# Patient Record
Sex: Male | Born: 1985 | Race: Black or African American | Hispanic: No | Marital: Single | Smoking: Never smoker
Health system: Southern US, Community
[De-identification: ages and names within clinical notes are randomized; demographics above are authoritative.]

## PROBLEM LIST (undated history)

## (undated) DIAGNOSIS — R011 Cardiac murmur, unspecified: Secondary | ICD-10-CM

## (undated) DIAGNOSIS — K5080 Crohn's disease of both small and large intestine without complications: Secondary | ICD-10-CM

## (undated) DIAGNOSIS — E559 Vitamin D deficiency, unspecified: Secondary | ICD-10-CM

## (undated) DIAGNOSIS — T7840XA Allergy, unspecified, initial encounter: Secondary | ICD-10-CM

## (undated) HISTORY — PX: OTHER SURGICAL HISTORY: SHX169

## (undated) HISTORY — DX: Cardiac murmur, unspecified: R01.1

## (undated) HISTORY — DX: Crohn's disease of both small and large intestine without complications: K50.80

## (undated) HISTORY — PX: COLONOSCOPY: SHX174

## (undated) HISTORY — DX: Allergy, unspecified, initial encounter: T78.40XA

## (undated) HISTORY — DX: Vitamin D deficiency, unspecified: E55.9

---

## 2015-05-04 ENCOUNTER — Encounter: Payer: Self-pay | Admitting: Gastroenterology

## 2015-05-18 ENCOUNTER — Encounter: Payer: Self-pay | Admitting: Nurse Practitioner

## 2015-05-18 ENCOUNTER — Ambulatory Visit (INDEPENDENT_AMBULATORY_CARE_PROVIDER_SITE_OTHER): Payer: 59 | Admitting: Nurse Practitioner

## 2015-05-18 VITALS — BP 124/71 | HR 65 | Temp 97.6°F | Ht 72.0 in | Wt 161.2 lb

## 2015-05-18 DIAGNOSIS — K50819 Crohn's disease of both small and large intestine with unspecified complications: Secondary | ICD-10-CM | POA: Insufficient documentation

## 2015-05-18 DIAGNOSIS — R634 Abnormal weight loss: Secondary | ICD-10-CM

## 2015-05-18 NOTE — Progress Notes (Signed)
Primary Care Physician:  [name] Primary Gastroenterologist:  Dr. Oneida Alar  Chief Complaint  Patient presents with  . Crohn's Disease    HPI:   30 year old male presents to establish care, referred by PCP. His previous GI has retired. He has a history of early-onset Crohn's disease. He was initially diagnosed at age 85 by colonoscopy at [facility] on 12/24/1999 with found single ulcer in the cecum, single ulcer in the descending colon, ulcers and hepatic flexure, ulcers in the ascending colon and distal small bowel involvement. He was on Pentasa for number of years with no GI complaints, no history of fissures or fistulas. Another colonoscopy completed 03/16/2009 with findings of quiescent Crohn's colitis, multiple random colon biopsies which showed no active or acute inflammation. Office note with previous GI for 09/03/2012 for 3-4 months of right-sided abdominal pain and loss of appetite, noted increased stress of parenthood. Colonoscopy recommended but does not appear to been completed. He was again seen 01/07/2014 for past due colonoscopy to monitor Crohn's and at that time had not been on Pentasa for over a month because "it no longer helped." At that time he was having generalized abdominal pain with no bleeding, stable weight. Procedural note dated 01/27/2014 showed scattered inflammation in the rectosigmoid colon; inflammation in the transverse colon; stricture, erosions  and ulcerations in the terminal ileum (which were biopsied.) Random colon biopsies taken as well, no polyps noted. Surgical pathology found terminal ileum biopsies with evidence of ulceration, random colon biopsies with mild to moderate amounts of chronic active inflammation. He was treated with prednisone at that time. Which resolved his symptoms. Last office note provided with previous provider dated 02/27/2014 for one month follow-up after Crohn's disease with prednisone treatment with noted no problems. At that time it  appears he started him on Remicade.  Today he states he was started on Remicaide and with his second dose in December 2015 or January 2016 he developed shortness of breath and was deemed to have an allergic reaction and Remicaide was stopped. He has not been on any medication for Crohn's in about 1 year. He has been doing pretty good lately. Still has decreased appetite, has lost 30 pounds in the past year. Wants to be 170 to 175, home weights around 150 to 155. Is having about 1-2 "normal" bowel movements a day, no hematochezia noted. Denies melena. No abdominal pain currently. Last episode of abdominal pain around Thanksgiving, but stools remained normal. Denies recent infections, fever, chills, N/V. No arthralgias currently, although he has had them in his past. Denies chest pain, dyspnea, dizziness, lightheadedness, syncope, near syncope. Denies any other upper or lower GI symptoms.  Past Medical History  Diagnosis Date  . Crohn's disease of both small and large intestine [facility])     Past Surgical History  Procedure Laterality Date  . None to date      As of 05/18/15    No current outpatient prescriptions on file.   No current facility-administered medications for this visit.    Allergies as of 05/18/2015  . (No Known Allergies)    Family History  Problem Relation Age of Onset  . Colon cancer Neg Hx   . Hypertension Mother   . Diabetes Mother   . Liver cancer Father     deceased age 60  . CVA Father     Social History   Social History  . Marital Status: Unknown    Spouse Name: N/A  . Number of  Children: N/A  . Years of Education: N/A   Occupational History  . Not on file.   Social History Main Topics  . Smoking status: Never Smoker   . Smokeless tobacco: Not on file  . Alcohol Use: No  . Drug Use: No  . Sexual Activity: Not on file   Other Topics Concern  . Not on file   Social History Narrative  . No narrative on file    Review of Systems: 10-point ROS  negative except as per HPI.    Physical Exam: BP 124/71 mmHg  Pulse 65  Temp(Src) 97.6 F (36.4 C) (Oral)  Ht 6' (1.829 m)  Wt 161 lb 3.2 oz (73.12 kg)  BMI 21.86 kg/m2 General:   Alert and oriented. Pleasant and cooperative. Well-nourished and well-developed.  Head:  Normocephalic and atraumatic. Eyes:  Without icterus, sclera clear and conjunctiva pink.  Ears:  Normal auditory acuity. Cardiovascular:  S1, S2 present without murmurs appreciated. Extremities without clubbing or edema. Respiratory:  Clear to auscultation bilaterally. No wheezes, rales, or rhonchi. No distress.  Gastrointestinal:  +BS, soft, non-tender and non-distended. No HSM noted. No guarding or rebound. No masses appreciated.  Rectal:  Deferred  Musculoskalatal:  Symmetrical without gross deformities. Joints non-tender to palpation including shoulders, knees, wrists, elbows, ankles. Skin:  Intact without significant lesions or rashes. Neurologic:  Alert and oriented x4;  grossly normal neurologically. Psych:  Alert and cooperative. Normal mood and affect. Heme/Lymph/Immune: No excessive bruising noted.    05/18/2015 3:22 PM

## 2015-05-18 NOTE — Assessment & Plan Note (Addendum)
30 year old male initially diagnosed with Crohn's disease at age 48 on colonoscopy and admission at [facility]. Disease was noted in terminal ileum and segmentally in the colon as noted in history of present illness. He did quite well for some time on Pentasa, however in 2015 it stopped working and he stopped taking it. Last colonoscopy completed 01/2014 which according to the procedural note found scattered inflammation in the rectosigmoid colon, inflammation the transverse colon, as well as stricture, erosions, and ulcerations in the terminal ileum. However, follow-up office visit stated Crohn's disease without complication. After his last colonoscopy he was started on Remicade. After second dose he developed significant allergic reaction including dyspnea and feeling of his throat closing up. His Remicade was stopped. Since then he has not been on any medication for his Crohn's disease because his GI provider retired.  Currently he is asymptomatic for the most part. Does have occasional cramping, last episode of abdominal pain around Thanksgiving of 2016. Has regular bowel movements, no diarrhea, no bleeding. While it is good that he is not symptomatic, his colonoscopies in the past have demonstrated significant disease. At this point it may be worthwhile to repeat a colonoscopy to correlate his asymptomatic state with physical findings of his colon and terminal ileum. He will likely need to be restarted on a medication. Mesalamine failed in the past, allergic reaction to Remicade. He may benefit from starting Humira after appropriate workup including blood tests, quantiferon, and hepatitis B screening. He states his only TB screening was a subdermal placed PPD.  I will discuss the case further with Dr. Oneida Alar for definitive plan moving forward and notify the patient.  We'll order CT of the abdomen and pelvis with contrast to check for any signs of stricture or obstruction. We'll update labs  including CBC, CMP, TSH. If the CT is okay and shows no obstruction we'll proceed with EGD and colonoscopy for Weight loss and Crohn surveillance before his next follow-up in 4-6 weeks.

## 2015-05-18 NOTE — Patient Instructions (Addendum)
1. Return for follow-up in 4-6 weeks. 2. We will notify you of any necessary blood work that needs to be completed.

## 2015-05-20 ENCOUNTER — Telehealth: Payer: Self-pay | Admitting: Nurse Practitioner

## 2015-05-20 NOTE — Addendum Note (Signed)
Addended by: Gordy Levan, ERIC A on: 05/20/2015 03:55 PM   Modules accepted: Orders

## 2015-05-20 NOTE — Telephone Encounter (Signed)
Called pt and was unable to leave a message no voicemail set up

## 2015-05-20 NOTE — Telephone Encounter (Signed)
Faxed labs to [facility] for CT has been sent to clinical review. Awaiting to see if any other clinical information needs to faxed to them.   Case # is [id number]

## 2015-05-20 NOTE — Progress Notes (Signed)
cc'ed to pcp °

## 2015-05-20 NOTE — Telephone Encounter (Signed)
Please notify the patient that I've put in routine labs for him to have drawn. I also put in for a CT scan to evaluate for the narrowing they saw on his last colonoscopy. If his CT comes back ok, we will want him to have an EGD and TCS with [facility] with 25 mg pre-procedure Phenergan before his next follow-up.

## 2015-05-21 NOTE — Telephone Encounter (Signed)
Noted  

## 2015-05-21 NOTE — Telephone Encounter (Signed)
Routing to [employer]

## 2015-05-21 NOTE — Telephone Encounter (Signed)
Spoke with pt and he is aware of ct and labs.   CT is still pending.  I will call pt once ct is set and that way he can make only 1 trip to [place] from [place]

## 2015-05-25 NOTE — Telephone Encounter (Signed)
Pt is aware of CT appt and will be getting labs done. Pt request email with information to be sent to him.   Sent email

## 2015-05-25 NOTE — Telephone Encounter (Signed)
CT approved [id number]

## 2015-05-28 ENCOUNTER — Ambulatory Visit (HOSPITAL_COMMUNITY): Payer: 59

## 2015-06-03 ENCOUNTER — Ambulatory Visit (HOSPITAL_COMMUNITY)
Admission: RE | Admit: 2015-06-03 | Discharge: 2015-06-03 | Disposition: A | Discharge status: Home / Self Care | Payer: 59 | Source: Ambulatory Visit | Attending: Nurse Practitioner | Admitting: Nurse Practitioner

## 2015-06-03 DIAGNOSIS — K50819 Crohn's disease of both small and large intestine with unspecified complications: Secondary | ICD-10-CM | POA: Diagnosis not present

## 2015-06-03 DIAGNOSIS — R634 Abnormal weight loss: Secondary | ICD-10-CM | POA: Insufficient documentation

## 2015-06-03 LAB — COMPREHENSIVE METABOLIC PANEL
ALBUMIN: 3.6 g/dL (ref 3.6–5.1)
ALT: 10 U/L (ref 9–46)
AST: 13 U/L (ref 10–40)
Alkaline Phosphatase: 76 U/L (ref 40–115)
BUN: 6 mg/dL — ABNORMAL LOW (ref 7–25)
CALCIUM: 9.4 mg/dL (ref 8.6–10.3)
CHLORIDE: 101 mmol/L (ref 98–110)
CO2: 28 mmol/L (ref 20–31)
Creat: 1.09 mg/dL (ref 0.60–1.35)
GLUCOSE: 74 mg/dL (ref 65–99)
Potassium: 4.1 mmol/L (ref 3.5–5.3)
Sodium: 137 mmol/L (ref 135–146)
Total Bilirubin: 0.5 mg/dL (ref 0.2–1.2)
Total Protein: 6.4 g/dL (ref 6.1–8.1)

## 2015-06-03 LAB — CBC WITH DIFFERENTIAL/PLATELET
BASOS PCT: 1 % (ref 0–1)
Basophils Absolute: 0 10*3/uL (ref 0.0–0.1)
EOS PCT: 4 % (ref 0–5)
Eosinophils Absolute: 0.1 10*3/uL (ref 0.0–0.7)
HEMATOCRIT: 38.8 % — AB (ref 39.0–52.0)
HEMOGLOBIN: 12.9 g/dL — AB (ref 13.0–17.0)
LYMPHS PCT: 18 % (ref 12–46)
Lymphs Abs: 0.6 10*3/uL — ABNORMAL LOW (ref 0.7–4.0)
MCH: 28.7 pg (ref 26.0–34.0)
MCHC: 33.2 g/dL (ref 30.0–36.0)
MCV: 86.4 fL (ref 78.0–100.0)
MONO ABS: 0.3 10*3/uL (ref 0.1–1.0)
MONOS PCT: 9 % (ref 3–12)
MPV: 8.7 fL (ref 8.6–12.4)
NEUTROS ABS: 2.4 10*3/uL (ref 1.7–7.7)
Neutrophils Relative %: 68 % (ref 43–77)
Platelets: 304 10*3/uL (ref 150–400)
RBC: 4.49 MIL/uL (ref 4.22–5.81)
RDW: 14.2 % (ref 11.5–15.5)
WBC: 3.6 10*3/uL — AB (ref 4.0–10.5)

## 2015-06-03 LAB — TSH: TSH: 1.47 mIU/L (ref 0.40–4.50)

## 2015-06-03 MED ORDER — IOHEXOL 300 MG/ML  SOLN
100.0000 mL | Freq: Once | INTRAMUSCULAR | Status: AC | PRN
Start: 2015-06-03 — End: 2015-06-03
  Administered 2015-06-03: 100 mL via INTRAVENOUS

## 2015-06-18 ENCOUNTER — Encounter (INDEPENDENT_AMBULATORY_CARE_PROVIDER_SITE_OTHER): Payer: Self-pay

## 2015-06-18 ENCOUNTER — Encounter: Payer: Self-pay | Admitting: Gastroenterology

## 2015-06-18 ENCOUNTER — Other Ambulatory Visit: Payer: Self-pay

## 2015-06-18 ENCOUNTER — Ambulatory Visit (INDEPENDENT_AMBULATORY_CARE_PROVIDER_SITE_OTHER): Payer: 59 | Admitting: Gastroenterology

## 2015-06-18 ENCOUNTER — Other Ambulatory Visit: Payer: Self-pay | Admitting: Gastroenterology

## 2015-06-18 VITALS — BP 134/82 | HR 79 | Temp 97.9°F | Ht 73.0 in | Wt 160.2 lb

## 2015-06-18 DIAGNOSIS — K50819 Crohn's disease of both small and large intestine with unspecified complications: Secondary | ICD-10-CM | POA: Diagnosis not present

## 2015-06-18 DIAGNOSIS — K5289 Other specified noninfective gastroenteritis and colitis: Secondary | ICD-10-CM

## 2015-06-18 DIAGNOSIS — K50019 Crohn's disease of small intestine with unspecified complications: Secondary | ICD-10-CM

## 2015-06-18 MED ORDER — NA SULFATE-K SULFATE-MG SULF 17.5-3.13-1.6 GM/177ML PO SOLN: 1.0000 | ORAL | Status: DC

## 2015-06-18 NOTE — Progress Notes (Signed)
ON RECALL  °

## 2015-06-18 NOTE — Progress Notes (Signed)
   Subjective:    Patient ID: Gerald Valentine, male    DOB: 1986/03/29, 30 y.o.   MRN: [id number]  Gerald Valentine  HPI Symptoms: abdominal pain. No diarrhea. MAY HAVE VOMITING: < 1-2X/MO. RARE BLOOD IN STOOL-1X Q3 MOS. IN SCHOOL ONLINE NOW AND IN BUSINESS. WEIGHT: LOST WEIGHT OVER LAST YEAR 30-40 LBS AND NOW STABLE AT 160 LBS. INITIALLY ON STEROIDS BUT LAST TIME  STOPPED PENTASA YEARS AGO: DIDN'T WORK. HAD REACTION REMICADE-2ND INFUSION. NO TB EXPOSURE-NEG PPD DEC 2015. NO EXPOSURE TO HEP B.  PT DENIES FEVER, CHILLS, nausea, melena, diarrhea, CHEST PAIN, SHORTNESS OF BREATH, CHANGE IN BOWEL IN HABITS, constipation,  problems swallowing, problems with sedation, OR heartburn or indigestion. NO SORES IN MOUTH, EYE PAIN, RASH ON LEGS, JOINT PAIN, OR BACK PAIN.  Past Medical History  Diagnosis Date  . Crohn's disease of both small and large intestine [facility])    Past Surgical History  Procedure Laterality Date  . None to date      As of 05/18/15   No Known Allergies  No current outpatient prescriptions on file.   Family History  Problem Relation Age of Onset  . Colon cancer Neg Hx   . Hypertension Mother   . Diabetes Mother   . Liver cancer Father     deceased age 64  . CVA Father    Social History   Social History  . Marital Status: Single    Spouse Name: N/A  . Number of Children: N/A  . Years of Education: N/A   Social History Main Topics  . Smoking status: Never Smoker   . Smokeless tobacco: None  . Alcohol Use: No  . Drug Use: No  . Sexual Activity: Not Asked   Other Topics Concern  . None   Social History Narrative   Review of Systems  PER HPI OTHERWISE ALL SYSTEMS ARE NEGATIVE. NO SWELLING IN KNEES IN PAST 2 YEARS.    Objective:   Physical Exam  Constitutional: He is oriented to person, place, and time. He appears well-developed and well-nourished. No distress.  HENT:  Head: Normocephalic and atraumatic.  Mouth/Throat: Oropharynx is clear and moist. No  oropharyngeal exudate.  Eyes: Pupils are equal, round, and reactive to light. No scleral icterus.  Neck: Normal range of motion. Neck supple.  Cardiovascular: Normal rate, regular rhythm and normal heart sounds.   Pulmonary/Chest: Effort normal and breath sounds normal. No respiratory distress.  Abdominal: Soft. Bowel sounds are normal. He exhibits no distension. There is no tenderness.  Musculoskeletal: He exhibits no edema.  Lymphadenopathy:    He has no cervical adenopathy.  Neurological: He is alert and oriented to person, place, and time.  Psychiatric: He has a normal mood and affect.  Vitals reviewed.     Assessment & Plan:

## 2015-06-18 NOTE — Patient Instructions (Signed)
Complete lab work.  COLONOSCOPY WITHIN THE NEXT MONTH.  WE WILL SUBMIT PAPERWORK TO START HUMIRA.  PLEASE CALL WITH QUESTIONS OR CONCERNS.  FOLLOW UP IN 3 MOS.

## 2015-06-18 NOTE — Assessment & Plan Note (Addendum)
DISEASE NOT CONTROLLED. CT SCAN JAN 2017-THICKENED ILEUM ON CT.  Complete lab work-TB assay/HEP B sAg. COLONOSCOPY WITHIN THE NEXT MONTH WITH ULTRASLIM SCOPE/SUPREP. CLEAR LIQUIDS AFTER 9 AM. PHENERGAN 12.5 MG IV IN PREOP. DISCUSSED PROCEDURE, BENEFITS, & RISKS: < 1% chance of medication reaction, bleeding, perforation, or rupture of spleen/liver. WE WILL SUBMIT PAPERWORK TO START HUMIRA. PLEASE CALL WITH QUESTIONS OR CONCERNS. FOLLOW UP IN 3 MOS.   GREATER THAN 50% WAS SPENT IN COUNSELING & COORDINATION OF CARE WITH THE PATIENT: DISCUSSED DIFFERENTIAL DIAGNOSIS, PROCEDURE, BENEFITS, RISKS, AND MANAGEMENT OF CROHN'S DISEASE. TOTAL ENCOUNTER TIME: 25 MINS.

## 2015-06-18 NOTE — Progress Notes (Signed)
CC'D TO PCP °

## 2015-06-18 NOTE — Progress Notes (Signed)
REVIEWED-NO ADDITIONAL RECOMMENDATIONS. 

## 2015-06-19 LAB — HEPATITIS B SURFACE ANTIGEN: HEP B S AG: NEGATIVE

## 2015-06-20 LAB — QUANTIFERON TB GOLD ASSAY (BLOOD)
Interferon Gamma Release Assay: NEGATIVE
Mitogen-Nil: 5.19 IU/mL
QUANTIFERON NIL VALUE: 0.03 [IU]/mL
Quantiferon Tb Ag Minus Nil Value: <0 IU/mL

## 2015-06-22 ENCOUNTER — Telehealth: Payer: Self-pay

## 2015-06-22 NOTE — Telephone Encounter (Signed)
TCS approval PA# [id number]

## 2015-06-30 ENCOUNTER — Telehealth: Payer: Self-pay | Admitting: Gastroenterology

## 2015-06-30 NOTE — Telephone Encounter (Signed)
PLEASE CALL PT. HIS TB AND HEP B TEST IS NEGATIVE.

## 2015-07-01 NOTE — Telephone Encounter (Signed)
LMOm to call.

## 2015-07-01 NOTE — Telephone Encounter (Signed)
Pt called and is aware.

## 2015-07-10 ENCOUNTER — Telehealth: Payer: Self-pay | Admitting: Nurse Practitioner

## 2015-07-10 ENCOUNTER — Encounter (HOSPITAL_COMMUNITY)
Admission: RE | Disposition: A | Discharge status: Home / Self Care | Payer: Self-pay | Source: Ambulatory Visit | Attending: Gastroenterology

## 2015-07-10 ENCOUNTER — Encounter (HOSPITAL_COMMUNITY): Payer: Self-pay | Admitting: *Deleted

## 2015-07-10 ENCOUNTER — Ambulatory Visit (HOSPITAL_COMMUNITY)
Admission: RE | Admit: 2015-07-10 | Discharge: 2015-07-10 | Disposition: A | Discharge status: Home / Self Care | Payer: 59 | Source: Ambulatory Visit | Attending: Gastroenterology | Admitting: Gastroenterology

## 2015-07-10 DIAGNOSIS — Z808 Family history of malignant neoplasm of other organs or systems: Secondary | ICD-10-CM | POA: Diagnosis not present

## 2015-07-10 DIAGNOSIS — R109 Unspecified abdominal pain: Secondary | ICD-10-CM | POA: Insufficient documentation

## 2015-07-10 DIAGNOSIS — R933 Abnormal findings on diagnostic imaging of other parts of digestive tract: Secondary | ICD-10-CM

## 2015-07-10 DIAGNOSIS — K5669 Other intestinal obstruction: Secondary | ICD-10-CM | POA: Insufficient documentation

## 2015-07-10 DIAGNOSIS — K5080 Crohn's disease of both small and large intestine without complications: Secondary | ICD-10-CM | POA: Diagnosis not present

## 2015-07-10 DIAGNOSIS — K5000 Crohn's disease of small intestine without complications: Secondary | ICD-10-CM | POA: Insufficient documentation

## 2015-07-10 DIAGNOSIS — K50019 Crohn's disease of small intestine with unspecified complications: Secondary | ICD-10-CM

## 2015-07-10 DIAGNOSIS — K5289 Other specified noninfective gastroenteritis and colitis: Secondary | ICD-10-CM

## 2015-07-10 DIAGNOSIS — K529 Noninfective gastroenteritis and colitis, unspecified: Secondary | ICD-10-CM | POA: Insufficient documentation

## 2015-07-10 HISTORY — PX: COLONOSCOPY: SHX5424

## 2015-07-10 SURGERY — COLONOSCOPY
Anesthesia: Moderate Sedation

## 2015-07-10 MED ORDER — MIDAZOLAM HCL 5 MG/5ML IJ SOLN
INTRAMUSCULAR | Status: AC
  Filled 2015-07-10: qty 10

## 2015-07-10 MED ORDER — PROMETHAZINE HCL 25 MG/ML IJ SOLN
12.5000 mg | Freq: Once | INTRAMUSCULAR | Status: AC
  Administered 2015-07-10: 12.5 mg via INTRAVENOUS

## 2015-07-10 MED ORDER — MIDAZOLAM HCL 5 MG/5ML IJ SOLN
INTRAMUSCULAR | Status: DC | PRN
  Administered 2015-07-10 (×2): 1 mg via INTRAVENOUS
  Administered 2015-07-10: 2 mg via INTRAVENOUS
  Administered 2015-07-10: 1 mg via INTRAVENOUS

## 2015-07-10 MED ORDER — STERILE WATER FOR IRRIGATION IR SOLN
Status: DC | PRN
  Administered 2015-07-10: 11:00:00

## 2015-07-10 MED ORDER — PREDNISONE 10 MG PO TABS: ORAL_TABLET | ORAL | Status: DC

## 2015-07-10 MED ORDER — PROMETHAZINE HCL 25 MG/ML IJ SOLN
INTRAMUSCULAR | Status: AC
  Filled 2015-07-10: qty 1

## 2015-07-10 MED ORDER — MEPERIDINE HCL 100 MG/ML IJ SOLN
INTRAMUSCULAR | Status: DC | PRN
  Administered 2015-07-10 (×3): 25 mg

## 2015-07-10 MED ORDER — SODIUM CHLORIDE 0.9% FLUSH
INTRAVENOUS | Status: AC
  Filled 2015-07-10: qty 10

## 2015-07-10 MED ORDER — SODIUM CHLORIDE 0.9 % IV SOLN
INTRAVENOUS | Status: DC
  Administered 2015-07-10: 1000 mL via INTRAVENOUS

## 2015-07-10 MED ORDER — MEPERIDINE HCL 100 MG/ML IJ SOLN
INTRAMUSCULAR | Status: AC
  Filled 2015-07-10: qty 2

## 2015-07-10 NOTE — Interval H&P Note (Signed)
History and Physical Interval Note:  07/10/2015 10:37 AM  Gerald Valentine  has presented today for surgery, with the diagnosis of crohn's ileitis/colitis  The various methods of treatment have been discussed with the patient and family. After consideration of risks, benefits and other options for treatment, the patient has consented to  Procedure(s) with comments: COLONOSCOPY (N/A) - 1030 as a surgical intervention .  The patient's history has been reviewed, patient examined, no change in status, stable for surgery.  I have reviewed the patient's chart and labs.  Questions were answered to the patient's satisfaction.     [employer]

## 2015-07-10 NOTE — H&P (View-Only) (Signed)
   Subjective:    Patient ID: Gerald Valentine, male    DOB: [birth year], 30 y.o.   MRN: [id number]  Gerald Valentine  HPI Symptoms: abdominal pain. No diarrhea. MAY HAVE VOMITING: < 1-2X/MO. RARE BLOOD IN STOOL-1X Q3 MOS. IN SCHOOL ONLINE NOW AND IN BUSINESS. WEIGHT: LOST WEIGHT OVER LAST YEAR 30-40 LBS AND NOW STABLE AT 160 LBS. INITIALLY ON STEROIDS BUT LAST TIME  STOPPED PENTASA YEARS AGO: DIDN'T WORK. HAD REACTION REMICADE-2ND INFUSION. NO TB EXPOSURE-NEG PPD DEC 2015. NO EXPOSURE TO HEP B.  PT DENIES FEVER, CHILLS, nausea, melena, diarrhea, CHEST PAIN, SHORTNESS OF BREATH, CHANGE IN BOWEL IN HABITS, constipation,  problems swallowing, problems with sedation, OR heartburn or indigestion. NO SORES IN MOUTH, EYE PAIN, RASH ON LEGS, JOINT PAIN, OR BACK PAIN.  Past Medical History  Diagnosis Date  . Crohn's disease of both small and large intestine [facility])    Past Surgical History  Procedure Laterality Date  . None to date      As of 05/18/15   No Known Allergies  No current outpatient prescriptions on file.   Family History  Problem Relation Age of Onset  . Colon cancer Neg Hx   . Hypertension Mother   . Diabetes Mother   . Liver cancer Father     deceased age 83  . CVA Father    Social History   Social History  . Marital Status: Single    Spouse Name: N/A  . Number of Children: N/A  . Years of Education: N/A   Social History Main Topics  . Smoking status: Never Smoker   . Smokeless tobacco: None  . Alcohol Use: No  . Drug Use: No  . Sexual Activity: Not Asked   Other Topics Concern  . None   Social History Narrative   Review of Systems  PER HPI OTHERWISE ALL SYSTEMS ARE NEGATIVE. NO SWELLING IN KNEES IN PAST 2 YEARS.    Objective:   Physical Exam  Constitutional: He is oriented to person, place, and time. He appears well-developed and well-nourished. No distress.  HENT:  Head: Normocephalic and atraumatic.  Mouth/Throat: Oropharynx is clear and moist. No  oropharyngeal exudate.  Eyes: Pupils are equal, round, and reactive to light. No scleral icterus.  Neck: Normal range of motion. Neck supple.  Cardiovascular: Normal rate, regular rhythm and normal heart sounds.   Pulmonary/Chest: Effort normal and breath sounds normal. No respiratory distress.  Abdominal: Soft. Bowel sounds are normal. He exhibits no distension. There is no tenderness.  Musculoskeletal: He exhibits no edema.  Lymphadenopathy:    He has no cervical adenopathy.  Neurological: He is alert and oriented to person, place, and time.  Psychiatric: He has a normal mood and affect.  Vitals reviewed.     Assessment & Plan:

## 2015-07-10 NOTE — Telephone Encounter (Signed)
Per endo please schedule May follow-up/PP appointment for this patient.

## 2015-07-10 NOTE — Discharge Instructions (Signed)
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   FOLLOW A LOW RESIDUE DIET.  START PREDNISONE AND TAPER OVER 1 MONTH.  AVOID TOBACCO PRODUCTS.  DO NOT USE ASPIRIN, BC/GOODY POWDERS,  OR IBUPROFEN, MOTRIN, ALEVE, OR NAPROXEN.   USE TYLENOL  AS NEEDED FOR ABDOMINAL PAIN.   ADD A PROBIOTIC DAILY. USE PROBIOTICS FOR AT LEAST 6 MOS.  FOLLOW UP IN MAY 2017.  Colonoscopy Care After Read the instructions outlined below and refer to this sheet in the next week. These discharge instructions provide you with general information on caring for yourself after you leave the hospital. While your treatment has been planned according to the most current medical practices available, unavoidable complications occasionally occur. If you have any problems or questions after discharge, call DR. Daily Crate, [phone].  ACTIVITY  You may resume your regular activity, but move at a slower pace for the next 24 hours.   Take frequent rest periods for the next 24 hours.   Walking will help get rid of the air and reduce the bloated feeling in your belly (abdomen).   No driving for 24 hours (because of the medicine (anesthesia) used during the test).   You may shower.   Do not sign any important legal documents or operate any machinery for 24 hours (because of the anesthesia used during the test).    NUTRITION  Drink plenty of fluids.   You may resume your normal diet as instructed by your doctor.   Begin with a light meal and progress to your normal diet. Heavy or fried foods are harder to digest and may make you feel sick to your stomach (nauseated).   Avoid alcoholic beverages for 24 hours or as instructed.    MEDICATIONS  You may resume your normal medications.   WHAT YOU CAN EXPECT TODAY  Some feelings of bloating in the abdomen.   Passage of more gas than usual.   Spotting of blood in your stool or on the toilet paper  .  IF  YOU HAD POLYPS REMOVED DURING THE COLONOSCOPY:  No aspirin products for 7 days or as instructed.   Eat a soft diet IF YOU HAVE NAUSEA, BLOATING, ABDOMINAL PAIN, OR VOMITING.    FINDING OUT THE RESULTS OF YOUR TEST Not all test results are available during your visit. DR. Oneida Alar WILL CALL YOU WITHIN 14 DAYS OF YOUR PROCEDUE WITH YOUR RESULTS. Do not assume everything is normal if you have not heard from DR. Ia Leeb, CALL HER OFFICE AT [phone].  SEEK IMMEDIATE MEDICAL ATTENTION AND CALL THE OFFICE: [phone] IF:  You have more than a spotting of blood in your stool.   Your belly is swollen (abdominal distention).   You are nauseated or vomiting.   You have a temperature over 101F.   You have abdominal pain or discomfort that is severe or gets worse throughout the day.   Low Fiber and Residue Restricted Diet A low fiber diet restricts foods that contain carbohydrates that are not digested in the small intestine. A diet containing about 10 g of fiber is considered low fiber. The diet needs to be individualized to suit patient tolerances and preferences and to avoid unnecessary restrictions. Generally, the foods emphasized in a low fiber diet have no skins or seeds. They may have been processed to remove bran, germ, or husks. Cooking may not necessarily eliminate the fiber. Cooking may, in fact, enable a greater quantity of fiber to be  consumed in a lesser volume. Legumes and nuts are also restricted. The term low residue has also been used to describe low fiber diets, although the two are not the same. Residue refers to any substance that adds to bowel (colonic) contents, such as sloughed cells and intestinal bacteria, in addition to fiber. Residue-containing foods, prunes and prune juice, milk, and connective tissue from meats may also need to be eliminated. It is important to eliminate these foods during sudden (acute) attacks of inflammatory bowel disease, when there is a partial  obstruction due to another reason, or when minimal fecal output is desired. When these problems are gone, a more normal diet may be used.  PURPOSE  Reduce stool weight and volume.   CHOOSING FOODS Check labels, especially on foods from the starch list. Often, dietary fiber content is listed with the Nutrition Facts panel.  Breads and Starches  Allowed: White, [place], and pita breads, plain rolls, buns, or sweet rolls, doughnuts, waffles, pancakes, bagels. Plain muffins, sweet breads, biscuits, matzoth. Flour. Soda, saltine, or graham crackers. Pretzels, rusks, melba toast, zwieback. Cooked cereals: cornmeal, farina, cream cereals. Dry cereals: refined corn, wheat, rice, and oat cereals (check label). Potatoes prepared any way without skins, refined macaroni, spaghetti, noodles, refined rice.   Avoid: Bread, rolls, or crackers made with whole-wheat, multigrains, rye, bran seeds, nuts, or coconut. Corn tortillas, table-shells. Corn chips, tortilla chips. Cereals containing whole-grains, multigrains, bran, coconut, nuts, or raisins. Cooked or dry oatmeal. Coarse wheat cereals, granola. Cereals advertised as "high fiber." Potato skins. Whole-grain pasta, wild or brown rice. Popcorn.  Vegetables  Allowed:  Strained tomato and vegetable juices. Fresh: tender lettuce, cucumber, cabbage, spinach, bean sprouts. Cooked, canned: asparagus, bean sprouts, cut green or wax beans, cauliflower, pumpkin, beets, mushrooms, olives, spinach, yellow squash, tomato, tomato sauce (no seeds), zucchini (peeled), turnips. Canned sweet potatoes. Small amounts of celery, onion, radish, and green pepper may be used. Keep servings limited to  cup.   Avoid: Fresh, cooked, or canned: artichokes, baked beans, beet greens, broccoli, Brussels sprouts, French-style green beans, corn, kale, legumes, peas, sweet potatoes. Cooked: green or red cabbage, spinach. Avoid large servings of any vegetables.  Fruit  Allowed:  All fruit  juices except prune juice. Cooked or canned: apricots applesauce, cantaloupe, cherries, grapefruit, grapes, kiwi, mandarin oranges, peaches, pears, fruit cocktail, pineapple, plums, watermelon. Fresh: banana, grapes, cantaloupe, avocado, cherries, pineapple, grapefruit, kiwi, nectarines, peaches, oranges, blueberries, plums. Keep servings limited to  cup or 1 piece.   Avoid: Fresh: apple with or without skin, apricots, mango, pears, raspberries, strawberries. Prune juice, stewed or dried prunes. Dried fruits, raisins, dates. Avoid large servings of all fresh fruits.  Meat and Meat Substitutes  Allowed:  Ground or well-cooked tender beef, ham, veal, lamb, pork, or poultry. Eggs, plain cheese. Fish, oysters, shrimp, lobster, other seafood. Liver, organ meats.   Avoid: Tough, fibrous meats with gristle. Peanut butter, smooth or chunky. Cheese with seeds, nuts, or other foods not allowed. Nuts, seeds, legumes, dried peas, beans, lentils.  Milk  Allowed:  All milk products except those not allowed. Milk and milk product consumption should be minimal when low residue is desired.   Avoid: Yogurt that contains nuts or seeds.  Soups and Combination Foods  Allowed:  Bouillon, broth, or cream soups made from allowed foods. Any strained soup. Casseroles or mixed dishes made with allowed foods.   Avoid: Soups made from vegetables that are not allowed or that contain other foods not allowed.  Desserts and Sweets  Allowed:  Plain cakes and cookies, pie made with allowed fruit, pudding, custard, cream pie. Gelatin, fruit, ice, sherbet, frozen ice pops. Ice cream, ice milk without nuts. Plain hard candy, honey, jelly, molasses, syrup, sugar, chocolate syrup, gumdrops, marshmallows.   Avoid: Desserts, cookies, or candies that contain nuts, peanut butter, or dried fruits. Jams, preserves with seeds, marmalade.  Fats and Oils  Allowed:  Margarine, butter, cream, mayonnaise, salad oils, plain salad dressings  made from allowed foods. Plain gravy, crisp bacon without rind.   Avoid: Seeds, nuts, olives. Avocados.  Beverages  Allowed:  All, except those listed to avoid.   Avoid: Fruit juices with high pulp, prune juice.  Condiments  Allowed:  Ketchup, mustard, horseradish, vinegar, cream sauce, cheese sauce, cocoa powder. Spices in moderation: allspice, basil, bay leaves, celery powder or leaves, cinnamon, cumin powder, curry powder, ginger, mace, marjoram, onion or garlic powder, oregano, paprika, parsley flakes, ground pepper, rosemary, sage, savory, tarragon, thyme, turmeric.   Avoid: Coconut, pickles.  SAMPLE MEAL PLAN The following menu is provided as a sample. Your daily menu plans will vary. Be sure to include a minimum of the following each day in order to provide essential nutrients for the adult:  Starch/Bread/Cereal Group, 6 servings.   Fruit/Vegetable Group, 5 servings.   Meat/Meat Substitute Group, 2 servings.   Milk/Milk Substitute Group, 2 servings.  A serving is equal to  cup for fruits, vegetables, and cooked cereals or 1 piece for foods such as a piece of bread, 1 orange, or 1 apple. For dry cereals and crackers, use serving sizes listed on the label. Combination foods may count as full or partial servings from various food groups. Fats, desserts, and sweets may be added to the meal plan after the requirements for essential nutrients are met. SAMPLE MENU Breakfast   cup orange juice.   1 boiled egg.   1 slice white toast.   Margarine.    cup cornflakes.   1 cup milk.   Beverage.  Lunch   cup chicken noodle soup.   2 to 3 oz sliced roast beef.   2 slices seedless rye bread.   Mayonnaise.    cup tomato juice.   1 small banana.   Beverage.  Dinner  3 oz baked chicken.    cup scalloped potatoes.    cup cooked beets.   White dinner roll.   Margarine.    cup canned peaches.   Beverage.

## 2015-07-10 NOTE — Op Note (Signed)
[facility] Patient Name: Gerald Valentine Procedure Date: 07/10/2015 10:26 AM MRN: [id number] Date of Birth: 1986-03-13 Attending MD: Barney Drain , MD CSN: [id number] Age: 30 Admit Type: Outpatient Procedure:                Colonoscopy Indications:              Crohn's disease of the small bowel and                            colon-DIAGNOSED AT AGE 41. THICKENED ILEUM ON CT                            FEB 2017. Providers:                Barney Drain, MD, Gwenlyn Fudge, RN, Isabella Stalling,                            Technician Referring MD:             Ephriam Jenkins, MD Medicines:                Promethazine 12.5 mg IV, Meperidine 75 mg IV,                            Midazolam 5 mg IV Complications:            No immediate complications. Estimated Blood Loss:     Estimated blood loss was minimal. Procedure:                Pre-Anesthesia Assessment:                           - Prior to the procedure, a History and Physical                            was performed, and patient medications and                            allergies were reviewed. The patient's tolerance of                            previous anesthesia was also reviewed. The risks                            and benefits of the procedure and the sedation                            options and risks were discussed with the patient.                            All questions were answered, and informed consent                            was obtained. Prior Anticoagulants: The patient has  taken no previous anticoagulant or antiplatelet                            agents. ASA Grade Assessment: I - A normal, healthy                            patient. After reviewing the risks and benefits,                            the patient was deemed in satisfactory condition to                            undergo the procedure.                           After obtaining informed consent, the colonoscope                             was passed under direct vision. Throughout the                            procedure, the patient's blood pressure, pulse, and                            oxygen saturations were monitored continuously. The                            EC-[id number] ([id number]) scope was introduced through                            the anus and advanced to the the transverse colon.                            The colonoscopy was technically difficult and                            complex due to significant looping, a tortuous                            colon and the patient's agitation. The patient                            tolerated the procedure. The quality of the bowel                            preparation was excellent. The EC-3890Li ([id number])                            scope was introduced through the anus and advanced                            to the 2 cm into the ileum. The terminal ileum,  ileocecal valve, appendiceal orifice, and rectum                            were photographed. The quality of the bowel                            preparation was excellent. Scope In: 10:57:57 AM Scope Out: 12:00:26 PM Scope Withdrawal Time: 0 hours 22 minutes 30 seconds  Total Procedure Duration: 1 hour 2 minutes 29 seconds  Findings:      The digital rectal exam was normal.      The distal ileum contained a benign-appearing, intrinsic severe stenosis       measuring 8 mm after dilation WITH TTS 10-12 mm balloon. Each setting       held for one minute.. Biopsies were taken with a cold forceps for       histology.      Inflammation characterized by friability, pseudopolyps and scarring was       found as patches surrounded by normal mucosa in the ascending colon and       at the cecum. The sigmoid colon, the descending colon and the transverse       colon were spared. This was mild in severity. Biopsies were taken with a       cold forceps for histology. Estimated blood loss  was minimal.      A scattered area of mildly erythematous mucosa was found in the rectum.       This was biopsied with a cold forceps for histology. Estimated blood       loss was minimal. 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 The following parameters were       monitored: oxygen saturation, heart rate, blood pressure, respiratory       rate, EKG, adequacy of pulmonary ventilation, and response to care.       Total physician intraservice time was 63 minutes. Recommendation:           - Low residue diet.                           - Continue present medications.                           - Await pathology results.                           FOLLOW A LOW RESIDUE DIET.  START PREDNISONE AND TAPER OVER 1 MONTH. PLAN TO                            START HUMIRA.                           AVOID TOBACCO PRODUCTS.                           DO NOT USE ASPIRIN, BC/GOODY POWDERS, OR IBUPROFEN,                            MOTRIN, ALEVE, OR NAPROXEN.                           USE TYLENOL AS NEEDED FOR ABDOMINAL PAIN.                           ADD A PROBIOTIC DAILY. USE PROBIOTICS FOR AT LEAST                            6 MOS.                           FOLLOW UP IN MAY 2017.                           PLAN REPEAT TCS W/ MAC/FLUOROSCOPY AND A PEDS SCOPE                            6-8 MOS AFTER HUMIRA INITIATED.                           - If the pathology report is benign, then repeat                            colonoscopy for surveillance in 6 months. Procedure Code(s):        --- Professional ---                           [phone], Colonoscopy, flexible;  with biopsy, single                            or multiple                           99153, Moderate sedation services; each additional                            15 minutes intraservice time                           99153, Moderate sedation services; each additional                            15 minutes intraservice time  99153, Moderate sedation services; each additional                            15 minutes intraservice time                           G0500, Moderate sedation services provided by the                            same physician or other qualified health care                            professional performing a gastrointestinal                            endoscopic service that sedation supports,                            requiring the presence of an independent trained                            observer to assist in the monitoring of the                            patient's level of consciousness and physiological                            status; initial 15 minutes of intra-service time;                            patient age 47 years or older (additional time may                            be reported with [phone], as appropriate) Diagnosis Code(s):        --- Professional ---                           [phone], Crohn's disease of both small and large                            intestine with intestinal obstruction                           K52.9, Noninfective gastroenteritis and colitis,                            unspecified                           K62.89, Other specified diseases of anus and rectum CPT copyright 2016 American Medical Association. All rights reserved. The codes documented in this report are preliminary and upon coder review may  be revised to meet current compliance requirements. Barney Drain, MD Barney Drain, MD 07/10/2015 2:34:06 PM This report has been signed electronically. Number of Addenda: 0

## 2015-07-13 NOTE — Telephone Encounter (Signed)
ON MAY RECALL FOR FU OV WITH [facility]

## 2015-07-14 ENCOUNTER — Encounter (HOSPITAL_COMMUNITY): Payer: Self-pay | Admitting: Gastroenterology

## 2015-07-16 ENCOUNTER — Telehealth: Payer: Self-pay | Admitting: Gastroenterology

## 2015-07-16 NOTE — Telephone Encounter (Signed)
humira paperwork is on [facility] desk.

## 2015-07-16 NOTE — Telephone Encounter (Signed)
PLEASE CALL PT. HIS BIOPSIES SHOW ACTIVE ILEITIS AND PROCTITIS. WE ARE SUBMITTING HIS PAPERWORK FOR HUMIRA.   FOLLOW A LOW RESIDUE DIET.  START PREDNISONE AND TAPER OVER 1 MONTH.  AVOID TOBACCO PRODUCTS.  DO NOT USE ASPIRIN, BC/GOODY POWDERS,  OR IBUPROFEN, MOTRIN, ALEVE, OR NAPROXEN.   USE TYLENOL  AS NEEDED FOR ABDOMINAL PAIN.   ADD A PROBIOTIC DAILY. USE PROBIOTICS FOR AT LEAST 6 MOS.  FOLLOW UP IN MAY 2017 E30 CROHN'S ILEO-PROCTOCOLITIS.

## 2015-07-17 NOTE — Telephone Encounter (Signed)
Pt is aware of results and that we are submitting paperwork for the Humira.

## 2015-08-05 ENCOUNTER — Encounter: Payer: Self-pay | Admitting: Gastroenterology

## 2015-10-15 ENCOUNTER — Telehealth: Payer: Self-pay | Admitting: Gastroenterology

## 2015-10-15 NOTE — Telephone Encounter (Signed)
CONTACTED PATIENT TO DISCUSS. HUMIRA. NOT A AVLID NUMBER. SEND LETTER FOR PT TO CONTACT OFFICE.

## 2015-12-15 ENCOUNTER — Telehealth: Payer: Self-pay

## 2015-12-15 NOTE — Telephone Encounter (Signed)
Noted and agree with keeping appt. Will discuss tomorrow in more detail.

## 2015-12-15 NOTE — Telephone Encounter (Signed)
Pt called and said he has had some abdominal pain several times over the last 3 months.   He feels it is related to Humira, which he started on about May.  He said the episodes come about a month apart and last for awhile with intermittent pain and then he will not have another episode for about a month.  Manuela Schwartz has scheduled him to see Roseanne Kaufman, NP on 12/16/2015 at 3:00 pm and I told him to keep that appt.

## 2015-12-16 ENCOUNTER — Encounter: Payer: Self-pay | Admitting: Gastroenterology

## 2015-12-16 ENCOUNTER — Ambulatory Visit (INDEPENDENT_AMBULATORY_CARE_PROVIDER_SITE_OTHER): Payer: 59 | Admitting: Gastroenterology

## 2015-12-16 VITALS — BP 124/80 | HR 91 | Temp 97.7°F | Ht 72.0 in | Wt 180.8 lb

## 2015-12-16 DIAGNOSIS — K50819 Crohn's disease of both small and large intestine with unspecified complications: Secondary | ICD-10-CM | POA: Diagnosis not present

## 2015-12-16 NOTE — Patient Instructions (Signed)
Please have blood work done today.  Start taking Prilosec once each morning, 30 minutes before the first meal of the day. You may take the first dose now.  I will see what the blood results show. We may need to do further testing.  Further recommendations to follow.   Call me if any worsening of pain or any changes at all!

## 2015-12-17 LAB — CBC
HCT: 44.5 % (ref 38.5–50.0)
Hemoglobin: 14.8 g/dL (ref 13.2–17.1)
MCH: 29 pg (ref 27.0–33.0)
MCHC: 33.3 g/dL (ref 32.0–36.0)
MCV: 87.3 fL (ref 80.0–100.0)
MPV: 9.6 fL (ref 7.5–12.5)
Platelets: 269 K/uL (ref 140–400)
RBC: 5.1 MIL/uL (ref 4.20–5.80)
RDW: 13.5 % (ref 11.0–15.0)
WBC: 3.4 K/uL — ABNORMAL LOW (ref 3.8–10.8)

## 2015-12-17 LAB — C-REACTIVE PROTEIN: CRP: 2.5 mg/dL — ABNORMAL HIGH

## 2015-12-17 LAB — SEDIMENTATION RATE: Sed Rate: 11 mm/hr (ref 0–15)

## 2015-12-25 ENCOUNTER — Encounter: Payer: Self-pay | Admitting: Gastroenterology

## 2015-12-25 NOTE — Assessment & Plan Note (Signed)
30 year old with Crohn's ileocolitis, recent colonoscopy as noted above. On Humira since May 2017. Now with 3 episodes of upper abdominal pain, belching, and reflux-type symptoms. With further discussion, does not appear to be having a typical flare or Crohn's related. To be thorough, will check CBC, sed rate, CRP. However, we do not have a baseline CRP or sed rate, but this can be followed over time. Will start on Prilosec, add Zofran for supportive measures, and follow closely. Any changes, patient to call right away. He will be due for early interval colonoscopy for surveillance in Nov 2017. Will get progress report next week. I have told him to avoid all NSAIDS and DO NOT TAKE diclofenac, which the ED at [facility] prescribed. He states understanding regarding this. Further recommendations to follow.

## 2015-12-25 NOTE — Progress Notes (Addendum)
REVIEWED-NO ADDITIONAL RECOMMENDATIONS.  Referring Provider: [name], FNP Primary Care Physician:  [name]  Primary GI: Dr. Oneida Alar   Chief Complaint  Patient presents with  . Abdominal Pain    started Saturday. 3rd time in 3 months    HPI:   Gerald Valentine is a 30 y.o. male presenting today with a history of Crohn's ileocolitis, diagnosed at age 41. He recently had a colonoscopy in March 2017: stricture in distal ileum due to Crohn's. pseudopolyp in descending colon. rectum pathology with active colitis, left and ascending colon with quiescent colitis, rectum with active colitis. Needs colonoscopy for surveillance 6 months after starting Humira, which was started in May 2017. He will be due for repeat colonoscopy Nov 2017.   Returns today stating he started having abdominal pain at the end of June. He has had only 3 episodes. Wonders if it is related to Humira. States it is in upper abdomen. Had pain Saturday, went to [facility] ED and given Diclofenac and Zofran. Pain is in upper abdomen, felt sore and bloated. Waxes and wanes, associated with reflux, indigestion, and usually associated with eating heavy foods. Associated belching with discomfort. Symptoms started after eating a steak. No rectal bleeding. Had some loose stool but better. Baseline bowel habits once to twice a day. Has only had 3 episodes of this pain. Last Humira injection a week ago. Feeling better today. Staying away from "anything that would mess my stomach up".   Past Medical History:  Diagnosis Date  . Crohn's disease of both small and large intestine [facility])     Past Surgical History:  Procedure Laterality Date  . COLONOSCOPY    . COLONOSCOPY N/A 07/10/2015   Dr. Oneida Alar: stricture in distal ileum due to Crohn's. pseudopolyp in descending colon. rectum pathology with active colitis, left and ascending colon with quiescent colitis, rectum with active colitis. 6 month Surveillance  needed after starting  Humira  (which was May 2017)   . None to date     As of 05/18/15    Current Outpatient Prescriptions  Medication Sig Dispense Refill  . diclofenac (VOLTAREN) 50 MG EC tablet Take 1 tablet by mouth 2 (two) times daily.    Marland Kitchen HUMIRA 40 MG/0.8ML PSKT Inject 40 mg into the skin every 14 (fourteen) days.    . ondansetron (ZOFRAN) 4 MG tablet Take 1 tablet by mouth every 8 (eight) hours as needed.    . Multiple Vitamin (MULTIVITAMIN WITH MINERALS) TABS tablet Take 1 tablet by mouth daily.     No current facility-administered medications for this visit.     Allergies as of 12/16/2015  . (No Known Allergies)    Family History  Problem Relation Age of Onset  . Hypertension Mother   . Liver cancer Father     deceased age 24  . CVA Father   . Colon cancer Neg Hx     Social History   Social History  . Marital status: Single    Spouse name: N/A  . Number of children: N/A  . Years of education: N/A   Social History Main Topics  . Smoking status: Never Smoker  . Smokeless tobacco: None  . Alcohol use No  . Drug use: No  . Sexual activity: Not Asked   Other Topics Concern  . None   Social History Narrative  . None    Review of Systems: Negative unless mentioned in HPI   Physical Exam: BP 124/80   Pulse 91  Temp 97.7 F (36.5 C) (Oral)   Ht 6' (1.829 m)   Wt 180 lb 12.8 oz (82 kg)   BMI 24.52 kg/m  General:   Alert and oriented. No distress noted. Pleasant and cooperative.  Head:  Normocephalic and atraumatic. Eyes:  Conjuctiva clear without scleral icterus. Mouth:  Oral mucosa pink and moist. Good dentition. No lesions. Abdomen:  +BS, soft, non-tender and non-distended. No rebound or guarding. No HSM or masses noted. Msk:  Symmetrical without gross deformities. Normal posture. Extremities:  Without edema. Neurologic:  Alert and  oriented x4;  grossly normal neurologically. Psych:  Alert and cooperative. Normal mood and affect.

## 2015-12-29 NOTE — Progress Notes (Signed)
Sed rate is normal. CRP elevated, but we do not have a baseline to compare to. Hgb normal. How is patient doing with addition of PPI? How are his symptoms? Is he improving?

## 2015-12-29 NOTE — Progress Notes (Signed)
CC'ED TO PCP 

## 2015-12-30 ENCOUNTER — Telehealth: Payer: Self-pay | Admitting: Gastroenterology

## 2015-12-30 NOTE — Telephone Encounter (Signed)
Pt called saying that he had a missed call from [place] and his VM isn't set up. I seen where DS had tried calling, but someone either picked up the line patient was on or the patient hung up. Please call [phone]

## 2015-12-30 NOTE — Progress Notes (Signed)
Tried to call pt and could not leave a message. Mailing a letter for him to call.

## 2015-12-30 NOTE — Telephone Encounter (Signed)
Tried to call and could not leave a message.

## 2015-12-31 NOTE — Telephone Encounter (Signed)
I spoke to pt and informed him of his results. He is doing better and hasn't been taking the Prilosec daily because he is doing better.  He did experience some pain yesterday, but nothing like it was in the past.  Michela Pitcher he will call if he has problems.

## 2015-12-31 NOTE — Telephone Encounter (Signed)
Noted  

## 2016-01-07 ENCOUNTER — Telehealth: Payer: Self-pay | Admitting: Gastroenterology

## 2016-01-07 NOTE — Telephone Encounter (Signed)
I called pt and he is at the ED in [place]. He would like to make a follow up appt with Dr. Oneida Alar.

## 2016-01-07 NOTE — Telephone Encounter (Signed)
Agree. If his pain is a 9 or 10, then he needs evaluation, especially with associated N/V. Needs labs, imaging, etc in ED. His symptoms at time of his appt appeared to correlate with eating. With his history of Crohn's, it complicates things.

## 2016-01-07 NOTE — Telephone Encounter (Signed)
Noted  

## 2016-01-07 NOTE — Telephone Encounter (Signed)
Pt was seen by AB on 8/23 and is still having stomach pains. He said it got worse during the night last night and has been going on for several days. Please advise and call [phone]

## 2016-01-07 NOTE — Telephone Encounter (Signed)
I spoke to pt and he said his stomach has been hurting for several days now, but the last 12 hours has really been bad.  He said it hurts all over, and feels like someone is squeezing and twisting his insides. He is very bloated.  He has vomited a couple of times.  Had a Bm this Am, but had been a couple of days without one.  He is not having any fever.  He rates his pain at a 9 or 10.  I told him he should go to the ED with pain at that level.  He said they do nothing for him in the ED.  Forwarding to Roseanne Kaufman, NP to advise!

## 2016-01-07 NOTE — Telephone Encounter (Signed)
OV made for 02/03/16 at 3pm with SF and I will have to mail patient a release of records before [facility] will send me anything.

## 2016-01-07 NOTE — Telephone Encounter (Signed)
Completely agree. Please make appt with Dr. Oneida Alar. We need to get records from [place].

## 2016-01-07 NOTE — Telephone Encounter (Signed)
Forwarding to [place].

## 2016-01-11 NOTE — Telephone Encounter (Signed)
Gerald Valentine, is he having any diarrhea? What did Angelina Sheriff do for him? Did they start on any steroids?

## 2016-01-12 NOTE — Telephone Encounter (Signed)
Pt said he is not having diarrhea, he is having regular BM's now.  They gave him a few pills for pain and Zofran for nausea, He thinks they gave him something else, but he does not know what it was and he is not taking anything now. Michela Pitcher he feels much better.

## 2016-01-12 NOTE — Telephone Encounter (Signed)
LMOM to call.

## 2016-01-12 NOTE — Telephone Encounter (Signed)
I mailed patient a release last week and another one today. Pt needs to sign and return to [place] ASAP before his next OV.

## 2016-01-12 NOTE — Telephone Encounter (Signed)
PT called and said they did not do any imaging. They did do labs. He will be glad to sign a release so we can get the reports. Forwarding to Manuela Schwartz to mail the release.

## 2016-01-12 NOTE — Telephone Encounter (Signed)
Did they do any imaging? If so, does he mind signing a release from there so we can get the records quicker? His appt is about a month away.

## 2016-01-13 NOTE — Telephone Encounter (Signed)
Pt is aware of is appt on 02/03/2016 at 3:00 PM with Dr. Oneida Alar and aware Manuela Schwartz has mailed him a release to sign and get back to [place] right away.

## 2016-02-03 ENCOUNTER — Encounter: Payer: Self-pay | Admitting: Gastroenterology

## 2016-02-03 ENCOUNTER — Other Ambulatory Visit: Payer: Self-pay | Admitting: Gastroenterology

## 2016-02-03 ENCOUNTER — Ambulatory Visit (INDEPENDENT_AMBULATORY_CARE_PROVIDER_SITE_OTHER): Payer: 59 | Admitting: Gastroenterology

## 2016-02-03 ENCOUNTER — Other Ambulatory Visit: Payer: Self-pay

## 2016-02-03 DIAGNOSIS — K50819 Crohn's disease of both small and large intestine with unspecified complications: Secondary | ICD-10-CM

## 2016-02-03 DIAGNOSIS — K50919 Crohn's disease, unspecified, with unspecified complications: Secondary | ICD-10-CM

## 2016-02-03 DIAGNOSIS — R109 Unspecified abdominal pain: Secondary | ICD-10-CM

## 2016-02-03 NOTE — Progress Notes (Signed)
   Subjective:    Patient ID: Gerald Valentine, male    DOB: 05-24-1985, 30 y.o.   MRN: [id number]  Gerald Valentine   HPI Had episodes of severe abdominal pain 3-4 times in upper abdomen. KEPT HIM AWAKE. NO CHANGE IN BOWEL HABITS. DID VOMIT AND FELT NAUSEATED. SYMPTOMS LASTED 1-2 DAYS. TWISTING PAIN AND DID NOT MOVE.WOULD COME AND GO. WHEN PRESENT IT WAS ON AND OFF FREQUENTLY AND BUT PAIN LASTED A FEW SECONDS. BETTER AFTER SHOT FOR PAIN AND ZOFRAN. WENT TO ED IN [facility] AND [place]. BMs: REGULAR. GOT LAID OFF LAST WEEK. INSURANCE ENDS AT THE END OF THE MONTH. BETTER ON HUMIRA SINCE MAR 2017.   PT DENIES FEVER, CHILLS, HEMATOCHEZIA, HEMATEMESIS, melena, diarrhea, CHEST PAIN, SHORTNESS OF BREATH,  CHANGE IN BOWEL IN HABITS, constipation, problems swallowing, problems with sedation, OR heartburn or indigestion.  Past Medical History:  Diagnosis Date  . Crohn's disease of both small and large intestine [facility])    Past Surgical History:  Procedure Laterality Date  . COLONOSCOPY    . COLONOSCOPY N/A 07/10/2015   Dr. Oneida Alar: stricture in distal ileum due to Crohn's. pseudopolyp in descending colon. rectum pathology with active colitis, left and ascending colon with quiescent colitis, rectum with active colitis. 6 month Surveillance  needed after starting Humira  (which was May 2017)   . None to date     As of 05/18/15   No Known Allergies  Current Outpatient Prescriptions  Medication Sig Dispense Refill  . HUMIRA 40 MG/0.8ML PSKT Inject 40 mg into the skin every 14 (fourteen) days.    .      . Multiple Vitamin (MULTIVITAMIN WITH MINERALS) TABS tablet Take 1 tablet by mouth daily.    . ondansetron (ZOFRAN) 4 MG tablet Take 1 tablet by mouth every 8 (eight) hours as needed.     Review of Systems PER HPI OTHERWISE ALL SYSTEMS ARE NEGATIVE.    Objective:   Physical Exam  Constitutional: He is oriented to person, place, and time. He appears well-developed and well-nourished. No distress.    HENT:  Head: Normocephalic and atraumatic.  Mouth/Throat: Oropharynx is clear and moist. No oropharyngeal exudate.  Eyes: Pupils are equal, round, and reactive to light. No scleral icterus.  Neck: Normal range of motion. Neck supple.  Cardiovascular: Normal rate, regular rhythm and normal heart sounds.   Pulmonary/Chest: Effort normal and breath sounds normal. No respiratory distress.  Abdominal: Soft. Bowel sounds are normal. He exhibits no distension. There is no tenderness.  Musculoskeletal: He exhibits no edema.  Lymphadenopathy:    He has no cervical adenopathy.  Neurological: He is alert and oriented to person, place, and time.  NO FOCAL DEFICITS  Psychiatric: He has a normal mood and affect.  Vitals reviewed.     Assessment & Plan:

## 2016-02-03 NOTE — Patient Instructions (Signed)
Complete labs AND CT SCAN.  COMPLETE PNEUMONIA VACCINE.  FILL OUT PAPER WORK FOR HUMIRA ASSISTANCE.  I WILL OBTAIN RECENT LABS FROM YOUR ED VISIT.  FOLLOW UP IN 3 MOS.

## 2016-02-03 NOTE — Assessment & Plan Note (Signed)
SYMPTOMS FAIRLY WELL CONTROLLED. OCCASIONAL INTERMITTENT UPPER ABDOMINAL PAIN WHICH MAY BE INTERMITTENT PARTIAL SMALL BOWEL OBSTRUCTION. [place] OCT 31.    Complete labs AND CT SCAN. COMPLETE PNEUMONIA VACCINE. FILL OUT PAPER WORK FOR HUMIRA ASSISTANCE. OBTAIN RECENT LABS FROM YOUR ED VISIT. AAS WHEN HE HAS ABDOMINAL PAIN. FOLLOW UP IN 3 MOS.

## 2016-02-04 LAB — VITAMIN B12: VITAMIN B 12: 221 pg/mL (ref 200–1100)

## 2016-02-04 LAB — HEPATITIS A ANTIBODY, TOTAL: HEP A TOTAL AB: NONREACTIVE

## 2016-02-04 LAB — HEPATITIS B SURFACE ANTIBODY, QUANTITATIVE: Hepatitis B-Post: 53.5 m[IU]/mL

## 2016-02-04 NOTE — Progress Notes (Signed)
CC'ED TO PCP 

## 2016-02-04 NOTE — Progress Notes (Signed)
On recall  °

## 2016-02-07 LAB — VITAMIN D 1,25 DIHYDROXY
Vitamin D 1, 25 (OH)2 Total: 39 pg/mL (ref 18–72)
Vitamin D2 1, 25 (OH)2: <8 pg/mL
Vitamin D3 1, 25 (OH)2: 39 pg/mL

## 2016-02-21 ENCOUNTER — Telehealth: Payer: Self-pay | Admitting: Gastroenterology

## 2016-02-21 NOTE — Telephone Encounter (Signed)
PLEASE CALL PT. HE HAS NOT BE EXPOSED TO HEPATITIS A. HE IS IMMUNE TO HEPATITIS B. HIS B12 IS LOW NORMAL. HIS VITAMIN D LEVEL IS NORMAL.   COMPLETE THE FLU SHOT AND THE HEPATITIS A VACCINE.  COMPLETE THE PNEUMONIA SHOTS. IT IS A TWO SHOT SERIES. YOU NEED PREVNAR FIRST THEN IN 8 WEEKS THE PNEUMOVAX.  COMPLETE PAPERWORK FOR HUMIRA.  PLEASE CALL WITH QUESTIONS OR CONCERNS.  FOLLOW UP IN JAN 2018.

## 2016-02-22 NOTE — Telephone Encounter (Signed)
ON RECALL  °

## 2016-02-23 ENCOUNTER — Telehealth: Payer: Self-pay | Admitting: Gastroenterology

## 2016-02-23 NOTE — Telephone Encounter (Signed)
Pt was returning call to DS. Please call [phone]

## 2016-02-23 NOTE — Telephone Encounter (Signed)
LMOM to call.

## 2016-02-23 NOTE — Telephone Encounter (Signed)
PT is aware. Said he needs to look for a PCP. He had got on at [place] now and said it is no need to complete the paperwork for the Humira.  He will have insurance soon.

## 2016-02-23 NOTE — Telephone Encounter (Signed)
REVIEWED-NO ADDITIONAL RECOMMENDATIONS. 

## 2016-02-23 NOTE — Telephone Encounter (Signed)
See result note, I spoke to pt.

## 2016-04-05 ENCOUNTER — Encounter: Payer: Self-pay | Admitting: Gastroenterology

## 2016-05-16 ENCOUNTER — Telehealth: Payer: Self-pay

## 2016-05-16 NOTE — Telephone Encounter (Signed)
Tried to do a PA for Humira. Pt has tried and failed: pentasa, remicade and prednisone. Insurance responded by saying Julianne Rice was denied. I called [employer], spoke with Scott. Explained to him what was going on. He re-did the PA, asked several questions which were not on the original form. PA has now been approved. I have called [place] at [phone] spoke with Sharyn Lull, and they are aware that it has been approved. She said they would call and arrange delivery with the pt.

## 2016-05-25 NOTE — Telephone Encounter (Signed)
[place] called- they have been unable to reach the pt by his phone or his mothers phone. I tried to call pts phone number and there was no answer and no voicemail and recording said the number was unavailable at this time.  Tried to call the mothers number listed in pts chart and it was disconnected. I found another number listed for the pts mother. Called her and spoke with her and the pt. The pt has changed his phone number to [phone]. I called [place] and spoke with Cristie Hem. I gave him the number and they will be calling the pt to schedule a delivery of his humira.

## 2016-06-09 ENCOUNTER — Ambulatory Visit (INDEPENDENT_AMBULATORY_CARE_PROVIDER_SITE_OTHER): Payer: BLUE CROSS/BLUE SHIELD | Admitting: Gastroenterology

## 2016-06-09 ENCOUNTER — Encounter: Payer: Self-pay | Admitting: Gastroenterology

## 2016-06-09 DIAGNOSIS — K50819 Crohn's disease of both small and large intestine with unspecified complications: Secondary | ICD-10-CM

## 2016-06-09 LAB — COMPLETE METABOLIC PANEL WITH GFR
ALT: 10 U/L (ref 9–46)
AST: 15 U/L (ref 10–40)
Albumin: 4 g/dL (ref 3.6–5.1)
Alkaline Phosphatase: 70 U/L (ref 40–115)
BUN: 9 mg/dL (ref 7–25)
CALCIUM: 9 mg/dL (ref 8.6–10.3)
CO2: 27 mmol/L (ref 20–31)
Chloride: 106 mmol/L (ref 98–110)
Creat: 1.3 mg/dL (ref 0.60–1.35)
GFR, EST AFRICAN AMERICAN: 86 mL/min (ref >=60)
GFR, Est Non African American: 74 mL/min (ref >=60)
Glucose, Bld: 85 mg/dL (ref 65–99)
POTASSIUM: 3.6 mmol/L (ref 3.5–5.3)
Sodium: 140 mmol/L (ref 135–146)
Total Bilirubin: 0.4 mg/dL (ref 0.2–1.2)
Total Protein: 6.5 g/dL (ref 6.1–8.1)

## 2016-06-09 LAB — CBC WITH DIFFERENTIAL/PLATELET
BASOS PCT: 0 %
Basophils Absolute: 0 cells/uL (ref 0–200)
Eosinophils Absolute: 123 cells/uL (ref 15–500)
Eosinophils Relative: 3 %
HEMATOCRIT: 38.8 % (ref 38.5–50.0)
HEMOGLOBIN: 12.9 g/dL — AB (ref 13.2–17.1)
LYMPHS ABS: 1517 {cells}/uL (ref 850–3900)
Lymphocytes Relative: 37 %
MCH: 29.1 pg (ref 27.0–33.0)
MCHC: 33.2 g/dL (ref 32.0–36.0)
MCV: 87.4 fL (ref 80.0–100.0)
MONO ABS: 328 {cells}/uL (ref 200–950)
MPV: 9.2 fL (ref 7.5–12.5)
Monocytes Relative: 8 %
Neutro Abs: 2132 cells/uL (ref 1500–7800)
Neutrophils Relative %: 52 %
Platelets: 269 10*3/uL (ref 140–400)
RBC: 4.44 MIL/uL (ref 4.20–5.80)
RDW: 13.7 % (ref 11.0–15.0)
WBC: 4.1 10*3/uL (ref 3.8–10.8)

## 2016-06-09 NOTE — Progress Notes (Signed)
cc'ed to pcp °

## 2016-06-09 NOTE — Assessment & Plan Note (Signed)
SYMPTOMS CONTROLLED/RESOLVED ON HUMIRA SINCE MAR 2017 AND DOSE INTERRUPTED OCT 2017 DUE TO INSURANCE REASONS.  Get FLU SHOT nd HEPATITIS A VACCINE. DISCUSSED INDICATION, BENEFITS, AND RISKS OF NOT GETTING VACCINATED.  AVOID ASPIRIN, BC/GODDY POWDERS, IBUPROFEN/MOTRIN, MOBIC/MELOXICAM, VOLTAREN(DICLOFENAC), OR NAPROXEN/ALEVE BECAUSE IT CAN MAKE YOUR CROHN'S FLARE UP. USE TYLENOL IF NEEDED FOR PAIN. COMPLETE CBC/HFP. FOLLOW UP IN 4-6 MOS.

## 2016-06-09 NOTE — Progress Notes (Signed)
ON RECALL  °

## 2016-06-09 NOTE — Progress Notes (Signed)
   Subjective:    Patient ID: Gerald Valentine, male    DOB: 1985-07-03, 31 y.o.   MRN: [id number]  [name]   HPI Bowels good. ON HUMIRA SINCE MAR 2017. MISSED A FEW DOSES IN OCT 2017.  NO EYE PAIN, SORES IN MOUTH, RASH ON LEGS, JOINT PAIN, OR BACK PAIN. SYMPTOMS CONTROLLED SINCE OCT 2017. BM DAILY: #4.   PT DENIES FEVER, CHILLS, HEMATOCHEZIA, HEMATEMESIS, nausea, vomiting, melena, diarrhea, CHEST PAIN, SHORTNESS OF BREATH,  CHANGE IN BOWEL IN HABITS, constipation, abdominal pain, problems swallowing, ODYNOPHAGIA, OR heartburn or indigestion.  Past Medical History:  Diagnosis Date  . Crohn's disease of both small and large intestine [facility])     Past Surgical History:  Procedure Laterality Date  . COLONOSCOPY    . COLONOSCOPY N/A 07/10/2015   Dr. Oneida Alar: stricture in distal ileum due to Crohn's. pseudopolyp in descending colon. rectum pathology with active colitis, left and ascending colon with quiescent colitis, rectum with active colitis. 6 month Surveillance  needed after starting Humira  (which was May 2017)   . None to date     As of 05/18/15   No Known Allergies  Current Outpatient Prescriptions  Medication Sig Dispense Refill  . HUMIRA 40 MG/0.8ML PSKT Inject 40 mg into the skin every 14 (fourteen) days.    .      .      .       Review of Systems PER HPI OTHERWISE ALL SYSTEMS ARE NEGATIVE.    Objective:   Physical Exam  Constitutional: He is oriented to person, place, and time. He appears well-developed and well-nourished. No distress.  HENT:  Head: Normocephalic and atraumatic.  Mouth/Throat: Oropharynx is clear and moist. No oropharyngeal exudate.  Eyes: Pupils are equal, round, and reactive to light. No scleral icterus.  Neck: Normal range of motion. Neck supple.  Cardiovascular: Normal rate, regular rhythm and normal heart sounds.   Pulmonary/Chest: Effort normal and breath sounds normal. No respiratory distress.  Abdominal: Soft. Bowel sounds are normal. He  exhibits no distension. There is no tenderness.  Musculoskeletal: He exhibits no edema.  Lymphadenopathy:    He has no cervical adenopathy.  Neurological: He is alert and oriented to person, place, and time.  NO FOCAL DEFICITS  Psychiatric: He has a normal mood and affect.  Vitals reviewed.     Assessment & Plan:

## 2016-06-09 NOTE — Patient Instructions (Signed)
Get your FLU SHOT nd HEPATITIS A VACCINE.  AVOID ASPIRIN, BC/GODDY POWDERS, IBUPROFEN/MOTRIN, MOBIC/MELOXICAM, VOLTAREN(DICLOFENAC), OR NAPROXEN/ALEVE BECAUSE IT CAN MAKE YOUR CROHN'S FLARE UP. USE TYLENOL IF NEEDED FOR PAIN.  COMPLETE LABS.  FOLLOW UP IN 4-6 MOS.

## 2016-06-13 NOTE — Progress Notes (Signed)
Pt is aware.  

## 2016-07-28 ENCOUNTER — Other Ambulatory Visit: Payer: Self-pay

## 2016-07-28 MED ORDER — HUMIRA 40 MG/0.8ML ~~LOC~~ PSKT: 40.0000 mg | PREFILLED_SYRINGE | SUBCUTANEOUS | 5 refills | Status: DC

## 2016-08-25 ENCOUNTER — Encounter: Payer: Self-pay | Admitting: Gastroenterology

## 2016-12-20 ENCOUNTER — Other Ambulatory Visit: Payer: Self-pay | Admitting: Gastroenterology

## 2017-02-01 ENCOUNTER — Ambulatory Visit: Payer: BLUE CROSS/BLUE SHIELD | Admitting: Gastroenterology

## 2017-02-23 ENCOUNTER — Ambulatory Visit: Payer: Self-pay | Admitting: Gastroenterology

## 2017-03-29 ENCOUNTER — Encounter: Payer: Self-pay | Admitting: *Deleted

## 2017-03-29 ENCOUNTER — Other Ambulatory Visit: Payer: Self-pay | Admitting: *Deleted

## 2017-03-29 ENCOUNTER — Ambulatory Visit (INDEPENDENT_AMBULATORY_CARE_PROVIDER_SITE_OTHER): Payer: BLUE CROSS/BLUE SHIELD | Admitting: Gastroenterology

## 2017-03-29 ENCOUNTER — Encounter: Payer: Self-pay | Admitting: Gastroenterology

## 2017-03-29 ENCOUNTER — Other Ambulatory Visit: Payer: Self-pay

## 2017-03-29 DIAGNOSIS — K50819 Crohn's disease of both small and large intestine with unspecified complications: Secondary | ICD-10-CM

## 2017-03-29 DIAGNOSIS — K50119 Crohn's disease of large intestine with unspecified complications: Secondary | ICD-10-CM

## 2017-03-29 MED ORDER — NA SULFATE-K SULFATE-MG SULF 17.5-3.13-1.6 GM/177ML PO SOLN: 1.0000 | ORAL | 0 refills | Status: DC

## 2017-03-29 NOTE — Assessment & Plan Note (Signed)
SYMPTOMS CONTROLLED/RESOLVED ON HUMIRA. DIAGNOSED AT AGE 31. STATES HE GOT THE HEP A VACCINE BUT NOT THE FLU SHOT.  WE WILL CONTACT YOU TO COMPLETE COLONOSCOPY WITHIN THE NEXT MONTH. DISCUSSED PROCEDURE, BENEFITS, & RISKS: < 1% chance of medication reaction, bleeding, perforation, or rupture of spleen/liver. CONTINUE HUMIRA. COMPLETE FLU SHOT. GET LABS IN FEB 2019: CBC AND VITAMIN B12. CALL WITH QUESTIONS OR CONCERNS. FOLLOW UP IN 6 MOS.

## 2017-03-29 NOTE — Patient Instructions (Addendum)
WE WILL CONTACT YOU TO COMPLETE COLONOSCOPY WITHIN THE NEXT MONTH.  CONTINUE HUMIRA.  COMPLETE FLU SHOT.  GET LABS IN FEB 2019: CBC AND VITAMIN B12.  FOLLOW UP IN 6 MOS.

## 2017-03-29 NOTE — Progress Notes (Signed)
   Subjective:    Patient ID: Gerald Valentine, male    DOB: 11/28/1985, 31 y.o.   MRN: [id number]  Patient, No Pcp Per   HPI Been doing well. No flares since LAST visit. APPETITE: GOOD. BMS: REGULAR/NORMAL. DIDN'T GET FLU SHOT. REPORTS GETTING HEP A VACCINE.  PT DENIES FEVER, CHILLS, HEMATOCHEZIA,   nausea, vomiting, melena, diarrhea, CHEST PAIN, SHORTNESS OF BREATH,  CHANGE IN BOWEL IN HABITS, RASH,CHANGE IN VISION, SORES IN MOUTH, RASH ON LEGS, JOINT PAIN, OR BACK PAIN. NOCTURNAL STOOL, constipation, abdominal pain, problems swallowing, OR heartburn or indigestion.  Past Medical History:  Diagnosis Date  . Crohn's disease of both small and large intestine [facility])     Past Surgical History:  Procedure Laterality Date  . COLONOSCOPY    . COLONOSCOPY N/A 07/10/2015   Dr. Oneida Alar: stricture in distal ileum due to Crohn's. pseudopolyp in descending colon. rectum pathology with active colitis, left and ascending colon with quiescent colitis, rectum with active colitis. 6 month Surveillance  needed after starting Humira  (which was May 2017)   . None to date     As of 05/18/15    No Known Allergies  Current Outpatient Medications  Medication Sig Dispense Refill  . HUMIRA 40 MG/0.8ML PSKT INJECT 1 PREFILLED SYRINGE (40MG) SUBCUTANEOUSLY EVERY OTHER WEEK     Review of Systems PER HPI OTHERWISE ALL SYSTEMS ARE NEGATIVE.    Objective:   Physical Exam  Constitutional: He is oriented to person, place, and time. He appears well-developed and well-nourished. No distress.  HENT:  Head: Normocephalic and atraumatic.  Mouth/Throat: Oropharynx is clear and moist. No oropharyngeal exudate.  Eyes: Pupils are equal, round, and reactive to light. No scleral icterus.  Neck: Normal range of motion. Neck supple.  Cardiovascular: Normal rate, regular rhythm and normal heart sounds.  Pulmonary/Chest: Effort normal and breath sounds normal. No respiratory distress.  Abdominal: Soft. Bowel sounds are normal.  He exhibits no distension. There is no tenderness.  Musculoskeletal: He exhibits no edema.  Lymphadenopathy:    He has no cervical adenopathy.  Neurological: He is alert and oriented to person, place, and time.  Psychiatric: He has a normal mood and affect.  Vitals reviewed.     Assessment & Plan:

## 2017-03-29 NOTE — Progress Notes (Signed)
NO PCP PER PATIENT

## 2017-03-29 NOTE — Progress Notes (Signed)
ON RECALL  °

## 2017-03-30 ENCOUNTER — Encounter: Payer: Self-pay | Admitting: *Deleted

## 2017-03-30 ENCOUNTER — Telehealth: Payer: Self-pay | Admitting: *Deleted

## 2017-03-30 NOTE — Telephone Encounter (Signed)
Called spoke with pt and is aware preop is scheduled for 05/05/17 at 11:00am. Letter mailed

## 2017-05-02 NOTE — Patient Instructions (Signed)
Gerald Valentine  05/02/2017     @PREFPERIOPPHARMACY @   Your procedure is scheduled on  05/09/2017 .  Report to [facility] at  615   A.M.  Call this number if you have problems the morning of surgery:  [phone]   Remember:  Do not eat food or drink liquids after midnight.  Take these medicines the morning of surgery with A SIP OF WATER  None   Do not wear jewelry, make-up or nail polish.  Do not wear lotions, powders, or perfumes, or deodorant.  Do not shave 48 hours prior to surgery.  Men may shave face and neck.  Do not bring valuables to the hospital.  [facility] is not responsible for any belongings or valuables.  Contacts, dentures or bridgework may not be worn into surgery.  Leave your suitcase in the car.  After surgery it may be brought to your room.  For patients admitted to the hospital, discharge time will be determined by your treatment team.  Patients discharged the day of surgery will not be allowed to drive home.   Name and phone number of your driver:   family Special instructions:  Follow the diet and prep instructions given to you by Dr Nona Dell office.  Please read over the following fact sheets that you were given. Anesthesia Post-op Instructions and Care and Recovery After Surgery       Colonoscopy, Adult A colonoscopy is an exam to look at the large intestine. It is done to check for problems, such as:  Lumps (tumors).  Growths (polyps).  Swelling (inflammation).  Bleeding.  What happens before the procedure? Eating and drinking Follow instructions from your doctor about eating and drinking. These instructions may include:  A few days before the procedure - follow a low-fiber diet. ? Avoid nuts. ? Avoid seeds. ? Avoid dried fruit. ? Avoid raw fruits. ? Avoid vegetables.  1-3 days before the procedure - follow a clear liquid diet. Avoid liquids that have red or purple dye. Drink only clear liquids, such as: ? Clear broth  or bouillon. ? Black coffee or tea. ? Clear juice. ? Clear soft drinks or sports drinks. ? Gelatin dessert. ? Popsicles.  On the day of the procedure - do not eat or drink anything during the 2 hours before the procedure.  Bowel prep If you were prescribed an oral bowel prep:  Take it as told by your doctor. Starting the day before your procedure, you will need to drink a lot of liquid. The liquid will cause you to poop (have bowel movements) until your poop is almost clear or light green.  If your skin or butt gets irritated from diarrhea, you may: ? Wipe the area with wipes that have medicine in them, such as adult wet wipes with aloe and vitamin E. ? Put something on your skin that soothes the area, such as petroleum jelly.  If you throw up (vomit) while drinking the bowel prep, take a break for up to 60 minutes. Then begin the bowel prep again. If you keep throwing up and you cannot take the bowel prep without throwing up, call your doctor.  General instructions  Ask your doctor about changing or stopping your normal medicines. This is important if you take diabetes medicines or blood thinners.  Plan to have someone take you home from the hospital or clinic. What happens during the procedure?  An IV tube may be put  into one of your veins.  You will be given medicine to help you relax (sedative).  To reduce your risk of infection: ? Your doctors will wash their hands. ? Your anal area will be washed with soap.  You will be asked to lie on your side with your knees bent.  Your doctor will get a long, thin, flexible tube ready. The tube will have a camera and a light on the end.  The tube will be put into your anus.  The tube will be gently put into your large intestine.  Air will be delivered into your large intestine to keep it open. You may feel some pressure or cramping.  The camera will be used to take photos.  A small tissue sample may be removed from your body  to be looked at under a microscope (biopsy). If any possible problems are found, the tissue will be sent to a lab for testing.  If small growths are found, your doctor may remove them and have them checked for cancer.  The tube that was put into your anus will be slowly removed. The procedure may vary among doctors and hospitals. What happens after the procedure?  Your doctor will check on you often until the medicines you were given have worn off.  Do not drive for 24 hours after the procedure.  You may have a small amount of blood in your poop.  You may pass gas.  You may have mild cramps or bloating in your belly (abdomen).  It is up to you to get the results of your procedure. Ask your doctor, or the department performing the procedure, when your results will be ready. This information is not intended to replace advice given to you by your health care provider. Make sure you discuss any questions you have with your health care provider. Document Released: 05/14/2010 Document Revised: 02/10/2016 Document Reviewed: 06/23/2015 Elsevier Interactive Patient Education  2017 [employer]  Colonoscopy, Adult, Care After This sheet gives you information about how to care for yourself after your procedure. Your health care provider may also give you more specific instructions. If you have problems or questions, contact your health care provider. What can I expect after the procedure? After the procedure, it is common to have:  A small amount of blood in your stool for 24 hours after the procedure.  Some gas.  Mild abdominal cramping or bloating.  Follow these instructions at home: General instructions   For the first 24 hours after the procedure: ? Do not drive or use machinery. ? Do not sign important documents. ? Do not drink alcohol. ? Do your regular daily activities at a slower pace than normal. ? Eat soft, easy-to-digest foods. ? Rest often.  Take over-the-counter or  prescription medicines only as told by your health care provider.  It is up to you to get the results of your procedure. Ask your health care provider, or the department performing the procedure, when your results will be ready. Relieving cramping and bloating  Try walking around when you have cramps or feel bloated.  Apply heat to your abdomen as told by your health care provider. Use a heat source that your health care provider recommends, such as a moist heat pack or a heating pad. ? Place a towel between your skin and the heat source. ? Leave the heat on for 20-30 minutes. ? Remove the heat if your skin turns bright red. This is especially important if you are unable  to feel pain, heat, or cold. You may have a greater risk of getting burned. Eating and drinking  Drink enough fluid to keep your urine clear or pale yellow.  Resume your normal diet as instructed by your health care provider. Avoid heavy or fried foods that are hard to digest.  Avoid drinking alcohol for as long as instructed by your health care provider. Contact a health care provider if:  You have blood in your stool 2-3 days after the procedure. Get help right away if:  You have more than a small spotting of blood in your stool.  You pass large blood clots in your stool.  Your abdomen is swollen.  You have nausea or vomiting.  You have a fever.  You have increasing abdominal pain that is not relieved with medicine. This information is not intended to replace advice given to you by your health care provider. Make sure you discuss any questions you have with your health care provider. Document Released: 11/24/2003 Document Revised: 01/04/2016 Document Reviewed: 06/23/2015 Elsevier Interactive Patient Education  2018 [place] Anesthesia is a term that refers to techniques, procedures, and medicines that help a person stay safe and comfortable during a medical procedure.  Monitored anesthesia care, or sedation, is one type of anesthesia. Your anesthesia specialist may recommend sedation if you will be having a procedure that does not require you to be unconscious, such as:  Cataract surgery.  A dental procedure.  A biopsy.  A colonoscopy.  During the procedure, you may receive a medicine to help you relax (sedative). There are three levels of sedation:  Mild sedation. At this level, you may feel awake and relaxed. You will be able to follow directions.  Moderate sedation. At this level, you will be sleepy. You may not remember the procedure.  Deep sedation. At this level, you will be asleep. You will not remember the procedure.  The more medicine you are given, the deeper your level of sedation will be. Depending on how you respond to the procedure, the anesthesia specialist may change your level of sedation or the type of anesthesia to fit your needs. An anesthesia specialist will monitor you closely during the procedure. Let your health care provider know about:  Any allergies you have.  All medicines you are taking, including vitamins, herbs, eye drops, creams, and over-the-counter medicines.  Any use of steroids (by mouth or as a cream).  Any problems you or family members have had with sedatives and anesthetic medicines.  Any blood disorders you have.  Any surgeries you have had.  Any medical conditions you have, such as sleep apnea.  Whether you are pregnant or may be pregnant.  Any use of cigarettes, alcohol, or street drugs. What are the risks? Generally, this is a safe procedure. However, problems may occur, including:  Getting too much medicine (oversedation).  Nausea.  Allergic reaction to medicines.  Trouble breathing. If this happens, a breathing tube may be used to help with breathing. It will be removed when you are awake and breathing on your own.  Heart trouble.  Lung trouble.  Before the procedure Staying  hydrated Follow instructions from your health care provider about hydration, which may include:  Up to 2 hours before the procedure - you may continue to drink clear liquids, such as water, clear fruit juice, black coffee, and plain tea.  Eating and drinking restrictions Follow instructions from your health care provider about eating and drinking, which may  include:  8 hours before the procedure - stop eating heavy meals or foods such as meat, fried foods, or fatty foods.  6 hours before the procedure - stop eating light meals or foods, such as toast or cereal.  6 hours before the procedure - stop drinking milk or drinks that contain milk.  2 hours before the procedure - stop drinking clear liquids.  Medicines Ask your health care provider about:  Changing or stopping your regular medicines. This is especially important if you are taking diabetes medicines or blood thinners.  Taking medicines such as aspirin and ibuprofen. These medicines can thin your blood. Do not take these medicines before your procedure if your health care provider instructs you not to.  Tests and exams  You will have a physical exam.  You may have blood tests done to show: ? How well your kidneys and liver are working. ? How well your blood can clot.  General instructions  Plan to have someone take you home from the hospital or clinic.  If you will be going home right after the procedure, plan to have someone with you for 24 hours.  What happens during the procedure?  Your blood pressure, heart rate, breathing, level of pain and overall condition will be monitored.  An IV tube will be inserted into one of your veins.  Your anesthesia specialist will give you medicines as needed to keep you comfortable during the procedure. This may mean changing the level of sedation.  The procedure will be performed. After the procedure  Your blood pressure, heart rate, breathing rate, and blood oxygen level  will be monitored until the medicines you were given have worn off.  Do not drive for 24 hours if you received a sedative.  You may: ? Feel sleepy, clumsy, or nauseous. ? Feel forgetful about what happened after the procedure. ? Have a sore throat if you had a breathing tube during the procedure. ? Vomit. This information is not intended to replace advice given to you by your health care provider. Make sure you discuss any questions you have with your health care provider. Document Released: 01/05/2005 Document Revised: 09/18/2015 Document Reviewed: 08/02/2015 Elsevier Interactive Patient Education  2018 [place], Care After These instructions provide you with information about caring for yourself after your procedure. Your health care provider may also give you more specific instructions. Your treatment has been planned according to current medical practices, but problems sometimes occur. Call your health care provider if you have any problems or questions after your procedure. What can I expect after the procedure? After your procedure, it is common to:  Feel sleepy for several hours.  Feel clumsy and have poor balance for several hours.  Feel forgetful about what happened after the procedure.  Have poor judgment for several hours.  Feel nauseous or vomit.  Have a sore throat if you had a breathing tube during the procedure.  Follow these instructions at home: For at least 24 hours after the procedure:   Do not: ? Participate in activities in which you could fall or become injured. ? Drive. ? Use heavy machinery. ? Drink alcohol. ? Take sleeping pills or medicines that cause drowsiness. ? Make important decisions or sign legal documents. ? Take care of children on your own.  Rest. Eating and drinking  Follow the diet that is recommended by your health care provider.  If you vomit, drink water, juice, or soup when you can drink without  vomiting.  Make sure you have little or no nausea before eating solid foods. General instructions  Have a responsible adult stay with you until you are awake and alert.  Take over-the-counter and prescription medicines only as told by your health care provider.  If you smoke, do not smoke without supervision.  Keep all follow-up visits as told by your health care provider. This is important. Contact a health care provider if:  You keep feeling nauseous or you keep vomiting.  You feel light-headed.  You develop a rash.  You have a fever. Get help right away if:  You have trouble breathing. This information is not intended to replace advice given to you by your health care provider. Make sure you discuss any questions you have with your health care provider. Document Released: 08/02/2015 Document Revised: 12/02/2015 Document Reviewed: 08/02/2015 Elsevier Interactive Patient Education  [employer].

## 2017-05-05 ENCOUNTER — Encounter (HOSPITAL_COMMUNITY)
Admission: RE | Admit: 2017-05-05 | Discharge: 2017-05-05 | Disposition: A | Discharge status: Home / Self Care | Payer: BLUE CROSS/BLUE SHIELD | Source: Ambulatory Visit | Attending: Gastroenterology | Admitting: Gastroenterology

## 2017-05-05 ENCOUNTER — Encounter (HOSPITAL_COMMUNITY): Payer: Self-pay

## 2017-05-05 ENCOUNTER — Other Ambulatory Visit: Payer: Self-pay

## 2017-05-05 DIAGNOSIS — Z01812 Encounter for preprocedural laboratory examination: Secondary | ICD-10-CM | POA: Insufficient documentation

## 2017-05-05 LAB — CBC WITH DIFFERENTIAL/PLATELET
Basophils Absolute: 0 10*3/uL (ref 0.0–0.1)
Basophils Relative: 0 %
Eosinophils Absolute: 0.1 10*3/uL (ref 0.0–0.7)
Eosinophils Relative: 2 %
HEMATOCRIT: 39.5 % (ref 39.0–52.0)
Hemoglobin: 12.7 g/dL — ABNORMAL LOW (ref 13.0–17.0)
LYMPHS ABS: 1.1 10*3/uL (ref 0.7–4.0)
LYMPHS PCT: 25 %
MCH: 29.3 pg (ref 26.0–34.0)
MCHC: 32.2 g/dL (ref 30.0–36.0)
MCV: 91.2 fL (ref 78.0–100.0)
MONOS PCT: 7 %
Monocytes Absolute: 0.3 10*3/uL (ref 0.1–1.0)
NEUTROS ABS: 3.1 10*3/uL (ref 1.7–7.7)
Neutrophils Relative %: 66 %
Platelets: 228 10*3/uL (ref 150–400)
RBC: 4.33 MIL/uL (ref 4.22–5.81)
RDW: 12.7 % (ref 11.5–15.5)
WBC: 4.6 10*3/uL (ref 4.0–10.5)

## 2017-05-05 LAB — VITAMIN B12: Vitamin B-12: 93 pg/mL — ABNORMAL LOW (ref 180–914)

## 2017-05-08 ENCOUNTER — Other Ambulatory Visit: Payer: Self-pay

## 2017-05-08 DIAGNOSIS — K50119 Crohn's disease of large intestine with unspecified complications: Secondary | ICD-10-CM

## 2017-05-08 NOTE — Progress Notes (Signed)
Pt is aware. Lab order on file for 06/08/2017.

## 2017-05-09 ENCOUNTER — Ambulatory Visit (HOSPITAL_COMMUNITY)
Admission: RE | Admit: 2017-05-09 | Discharge: 2017-05-09 | Disposition: A | Discharge status: Home / Self Care | Payer: BLUE CROSS/BLUE SHIELD | Source: Ambulatory Visit | Attending: Gastroenterology | Admitting: Gastroenterology

## 2017-05-09 ENCOUNTER — Encounter (HOSPITAL_COMMUNITY): Payer: Self-pay | Admitting: *Deleted

## 2017-05-09 ENCOUNTER — Encounter (HOSPITAL_COMMUNITY)
Admission: RE | Disposition: A | Discharge status: Home / Self Care | Payer: Self-pay | Source: Ambulatory Visit | Attending: Gastroenterology

## 2017-05-09 ENCOUNTER — Ambulatory Visit (HOSPITAL_COMMUNITY): Payer: BLUE CROSS/BLUE SHIELD | Admitting: Anesthesiology

## 2017-05-09 DIAGNOSIS — Z823 Family history of stroke: Secondary | ICD-10-CM | POA: Diagnosis not present

## 2017-05-09 DIAGNOSIS — K50812 Crohn's disease of both small and large intestine with intestinal obstruction: Secondary | ICD-10-CM

## 2017-05-09 DIAGNOSIS — Q438 Other specified congenital malformations of intestine: Secondary | ICD-10-CM | POA: Insufficient documentation

## 2017-05-09 DIAGNOSIS — K5080 Crohn's disease of both small and large intestine without complications: Secondary | ICD-10-CM | POA: Diagnosis present

## 2017-05-09 DIAGNOSIS — K56699 Other intestinal obstruction unspecified as to partial versus complete obstruction: Secondary | ICD-10-CM | POA: Insufficient documentation

## 2017-05-09 DIAGNOSIS — Z79899 Other long term (current) drug therapy: Secondary | ICD-10-CM | POA: Diagnosis not present

## 2017-05-09 DIAGNOSIS — Z8249 Family history of ischemic heart disease and other diseases of the circulatory system: Secondary | ICD-10-CM | POA: Insufficient documentation

## 2017-05-09 DIAGNOSIS — Z800 Family history of malignant neoplasm of digestive organs: Secondary | ICD-10-CM | POA: Insufficient documentation

## 2017-05-09 DIAGNOSIS — K50112 Crohn's disease of large intestine with intestinal obstruction: Secondary | ICD-10-CM

## 2017-05-09 HISTORY — PX: COLONOSCOPY WITH PROPOFOL: SHX5780

## 2017-05-09 HISTORY — PX: BIOPSY: SHX5522

## 2017-05-09 SURGERY — COLONOSCOPY WITH PROPOFOL
Anesthesia: Monitor Anesthesia Care

## 2017-05-09 MED ORDER — MIDAZOLAM HCL 2 MG/2ML IJ SOLN
1.0000 mg | INTRAMUSCULAR | Status: AC
  Administered 2017-05-09: 2 mg via INTRAVENOUS

## 2017-05-09 MED ORDER — PROPOFOL 10 MG/ML IV BOLUS
INTRAVENOUS | Status: AC
  Filled 2017-05-09: qty 40

## 2017-05-09 MED ORDER — PROPOFOL 10 MG/ML IV BOLUS
INTRAVENOUS | Status: DC | PRN
  Administered 2017-05-09 (×2): 10 mg via INTRAVENOUS

## 2017-05-09 MED ORDER — MIDAZOLAM HCL 2 MG/2ML IJ SOLN
INTRAMUSCULAR | Status: AC
  Filled 2017-05-09: qty 2

## 2017-05-09 MED ORDER — FENTANYL CITRATE (PF) 100 MCG/2ML IJ SOLN
INTRAMUSCULAR | Status: AC
  Filled 2017-05-09: qty 2

## 2017-05-09 MED ORDER — FENTANYL CITRATE (PF) 100 MCG/2ML IJ SOLN
25.0000 ug | Freq: Once | INTRAMUSCULAR | Status: AC
  Administered 2017-05-09: 25 ug via INTRAVENOUS

## 2017-05-09 MED ORDER — LACTATED RINGERS IV SOLN
INTRAVENOUS | Status: DC
  Administered 2017-05-09: 07:00:00 via INTRAVENOUS

## 2017-05-09 MED ORDER — PROPOFOL 500 MG/50ML IV EMUL
INTRAVENOUS | Status: DC | PRN
  Administered 2017-05-09: 150 ug/kg/min via INTRAVENOUS
  Administered 2017-05-09: 08:00:00 via INTRAVENOUS

## 2017-05-09 NOTE — Anesthesia Preprocedure Evaluation (Signed)
Anesthesia Evaluation  Patient identified by MRN, date of birth, ID band Patient awake    Reviewed: Allergy & Precautions, NPO status , Patient's Chart, lab work & pertinent test results  Airway Mallampati: II  TM Distance: >3 FB Neck ROM: Full    Dental  (+) Teeth Intact   Pulmonary neg pulmonary ROS,    breath sounds clear to auscultation       Cardiovascular negative cardio ROS   Rhythm:Regular Rate:Normal     Neuro/Psych negative neurological ROS  negative psych ROS   GI/Hepatic Crohn's Dx   Endo/Other    Renal/GU negative Renal ROS     Musculoskeletal   Abdominal   Peds  Hematology negative hematology ROS (+)   Anesthesia Other Findings   Reproductive/Obstetrics                             Anesthesia Physical Anesthesia Plan  ASA: II  Anesthesia Plan: MAC   Post-op Pain Management:    Induction:   PONV Risk Score and Plan:   Airway Management Planned: Simple Face Mask  Additional Equipment:   Intra-op Plan:   Post-operative Plan:   Informed Consent: I have reviewed the patients History and Physical, chart, labs and discussed the procedure including the risks, benefits and alternatives for the proposed anesthesia with the patient or authorized representative who has indicated his/her understanding and acceptance.     Plan Discussed with:   Anesthesia Plan Comments:         Anesthesia Quick Evaluation

## 2017-05-09 NOTE — Discharge Instructions (Signed)
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 YOUR COLON LOOKS NORMAL.  I BIOPSIED YOUR COLON, AND RECTUM.   START VITAMIN B12 1000 MCG DAILY.  CONTINUE HUMIRA.  DO NOT USE ASPIRIN, BC/GOODY POWDERS,  OR IBUPROFEN, MOTRIN, ALEVE, OR NAPROXEN BECAUSE IT CAN CAUSE FLARES OF YOUR CROHN'S DISEASE  USE TYLENOL IF NEEDED FOR ABDOMINAL PAIN.   FOLLOW UP IN JUN 2019.  Colonoscopy Care After Read the instructions outlined below and refer to this sheet in the next week. These discharge instructions provide you with general information on caring for yourself after you leave the hospital. While your treatment has been planned according to the most current medical practices available, unavoidable complications occasionally occur. If you have any problems or questions after discharge, call DR. FIELDS, [phone].  ACTIVITY  You may resume your regular activity, but move at a slower pace for the next 24 hours.   Take frequent rest periods for the next 24 hours.   Walking will help get rid of the air and reduce the bloated feeling in your belly (abdomen).   No driving for 24 hours (because of the medicine (anesthesia) used during the test).   You may shower.   Do not sign any important legal documents or operate any machinery for 24 hours (because of the anesthesia used during the test).    NUTRITION  Drink plenty of fluids.   You may resume your normal diet as instructed by your doctor.   Begin with a light meal and progress to your normal diet. Heavy or fried foods are harder to digest and may make you feel sick to your stomach (nauseated).   Avoid alcoholic beverages for 24 hours or as instructed.    MEDICATIONS  You may resume your normal medications.   WHAT YOU CAN EXPECT TODAY  Some feelings of bloating in the abdomen.   Passage of more gas than usual.   Spotting of blood in your stool or on the toilet paper  .  IF YOU HAD POLYPS  REMOVED DURING THE COLONOSCOPY:  No aspirin products for 7 days or as instructed.   Eat a soft diet IF YOU HAVE NAUSEA, BLOATING, ABDOMINAL PAIN, OR VOMITING.    FINDING OUT THE RESULTS OF YOUR TEST Not all test results are available during your visit. DR. Oneida Alar WILL CALL YOU WITHIN 14 DAYS OF YOUR PROCEDUE WITH YOUR RESULTS. Do not assume everything is normal if you have not heard from DR. FIELDS, CALL HER OFFICE AT [phone].  SEEK IMMEDIATE MEDICAL ATTENTION AND CALL THE OFFICE: [phone] IF:  You have more than a spotting of blood in your stool.   Your belly is swollen (abdominal distention).   You are nauseated or vomiting.   You have a temperature over 101F.   You have abdominal pain or discomfort that is severe or gets worse throughout the day.   Moderate Conscious Sedation, Adult, Care After These instructions provide you with information about caring for yourself after your procedure. Your health care provider may also give you more specific instructions. Your treatment has been planned according to current medical practices, but problems sometimes occur. Call your health care provider if you have any problems or questions after your procedure. What can I expect after the procedure? After your procedure, it is common:  To feel sleepy for several hours.  To feel clumsy and have poor balance for several hours.  To have poor judgment for several hours.  To vomit if you eat too soon.  Follow these instructions at home: For at least 24 hours after the procedure:   Do not: ? Participate in activities where you could fall or become injured. ? Drive. ? Use heavy machinery. ? Drink alcohol. ? Take sleeping pills or medicines that cause drowsiness. ? Make important decisions or sign legal documents. ? Take care of children on your own.  Rest. Eating and drinking  Follow the diet recommended by your health care provider.  If you vomit: ? Drink water, juice,  or soup when you can drink without vomiting. ? Make sure you have little or no nausea before eating solid foods. General instructions  Have a responsible adult stay with you until you are awake and alert.  Take over-the-counter and prescription medicines only as told by your health care provider.  If you smoke, do not smoke without supervision.  Keep all follow-up visits as told by your health care provider. This is important. Contact a health care provider if:  You keep feeling nauseous or you keep vomiting.  You feel light-headed.  You develop a rash.  You have a fever. Get help right away if:  You have trouble breathing. This information is not intended to replace advice given to you by your health care provider. Make sure you discuss any questions you have with your health care provider. Document Released: 01/30/2013 Document Revised: 09/14/2015 Document Reviewed: 08/01/2015 Elsevier Interactive Patient Education  [employer].

## 2017-05-09 NOTE — Op Note (Signed)
[facility] Patient Name: Gerald Valentine Procedure Date: 05/09/2017 7:25 AM MRN: [id number] Date of Birth: 07/21/1985 Attending MD: Barney Drain MD, MD CSN: [id number] Age: 32 Admit Type: Outpatient Procedure:                Colonoscopy WITH COLD FORCEPS BIOPSY Q10CM Indications:              Follow-up of Crohn's disease of the small bowel and                            colon-JAN 2019 VITAMIN B12 93. Providers:                Barney Drain MD, MD, Jeanann Lewandowsky. Sharon Seller, RN, Nelma Rothman, Technician, Randa Spike, Technician Referring MD:              Medicines:                Propofol per Anesthesia Complications:            No immediate complications. Estimated Blood Loss:     Estimated blood loss was minimal. Procedure:                Pre-Anesthesia Assessment:                           - Prior to the procedure, a History and Physical                            was performed, and patient medications and                            allergies were reviewed. The patient's tolerance of                            previous anesthesia was also reviewed. The risks                            and benefits of the procedure and the sedation                            options and risks were discussed with the patient.                            All questions were answered, and informed consent                            was obtained. Prior Anticoagulants: The patient has                            taken no previous anticoagulant or antiplatelet                            agents. ASA Grade Assessment: II - A patient with  mild systemic disease. After reviewing the risks                            and benefits, the patient was deemed in                            satisfactory condition to undergo the procedure.                            After obtaining informed consent, the colonoscope                            was passed under direct vision.  Throughout the                            procedure, the patient's blood pressure, pulse, and                            oxygen saturations were monitored continuously. The                            EC-[id number] ([id number]) scope was introduced through                            the anus and advanced to the the cecum, identified                            by the ileocecal valve. The ileocecal valve,                            appendiceal orifice, and rectum were photographed.                            The colonoscopy was technically difficult and                            complex due to a tortuous colon. Successful                            completion of the procedure was aided by COLOWRAP.                            The patient tolerated the procedure well. The                            quality of the bowel preparation was excellent. Scope In: 7:42:37 AM Scope Out: 8:12:50 AM Scope Withdrawal Time: 0 hours 24 minutes 23 seconds  Total Procedure Duration: 0 hours 30 minutes 13 seconds  Findings:      The ileocecal valve contained a benign-appearing, intrinsic moderate       stenosis measuring 1.2 cm (inner diameter) that was non-traversed.      The Simple Endoscopic Score for Crohn's Disease was determined based on       the endoscopic appearance of the mucosa  in the following segments:      - Ileum: Findings include no ulcers present, no ulcerated surfaces, no       affected surfaces and a single narrowing that can be passed.      - Right Colon: Findings include no ulcers present, no ulcerated       surfaces, no affected surfaces and no narrowings.      - Transverse Colon: Findings include no ulcers present, no ulcerated       surfaces, no affected surfaces and no narrowings.      - Left Colon: Findings include no ulcers present, no ulcerated surfaces,       no affected surfaces and no narrowings.      - Rectum: Findings include no ulcers present, no ulcerated surfaces, no        affected surfaces and no narrowings.      - Total SES-CD scoring: total aggregate score was 1. Four biopsies were       taken every 10 cm with a cold forceps from the cecum, ascending colon,       transverse colon, descending colon, sigmoid colon and rectum for Crohn's       disease surveillance. These biopsy specimens from the cecum, ascending       colon, transverse colon, descending colon, sigmoid colon and rectum were       sent to Pathology.      The recto-sigmoid colon and sigmoid colon were moderately redundant.      The exam was otherwise without abnormality on direct and retroflexion       views. Impression:               - Stricture at the ileocecal valve.                           - Simple Endoscopic Score for Crohn's Disease:                            total aggregate score was 1, mucosal inflammatory                            changes secondary to Crohn's disease, in remission.                            Biopsied.                           - Redundant colon.                           - The examination was otherwise normal on direct                            and retroflexion views. Moderate Sedation:      Per Anesthesia Care Recommendation:           - Resume previous diet.                           - Continue present medications.                           -  Await pathology results.                           - Repeat colonoscopy 1-3 YEARS for surveillance                            WITH AN ULTRASLIM SCOPE.                           - Return to GI office in 5 months.                           - Patient has a contact number available for                            emergencies. The signs and symptoms of potential                            delayed complications were discussed with the                            patient. Return to normal activities tomorrow.                            Written discharge instructions were provided to the                             patient. Procedure Code(s):        --- Professional ---                           [phone], Colonoscopy, flexible; with biopsy, single                            or multiple Diagnosis Code(s):        --- Professional ---                           A25.053, Crohn's disease of both small and large                            intestine with intestinal obstruction                           Q43.8, Other specified congenital malformations of                            intestine CPT copyright 2016 American Medical Association. All rights reserved. The codes documented in this report are preliminary and upon coder review may  be revised to meet current compliance requirements. Barney Drain, MD Barney Drain MD, MD 05/09/2017 8:27:20 AM This report has been signed electronically. Number of Addenda: 0

## 2017-05-09 NOTE — Anesthesia Postprocedure Evaluation (Signed)
Anesthesia Post Note  Patient: Gerald Valentine  Procedure(s) Performed: COLONOSCOPY WITH PROPOFOL (N/A ) BIOPSY  Patient location during evaluation: Short Stay Anesthesia Type: MAC Level of consciousness: awake and alert and oriented Pain management: pain level controlled Vital Signs Assessment: post-procedure vital signs reviewed and stable Respiratory status: spontaneous breathing Cardiovascular status: blood pressure returned to baseline Postop Assessment: no apparent nausea or vomiting Anesthetic complications: no     Last Vitals:  Vitals:   05/09/17 0830 05/09/17 0844  BP: 112/69 139/79  Pulse: 71 79  Resp: 18 17  Temp:  36.5 C  SpO2: 100% 96%    Last Pain:  Vitals:   05/09/17 0844  TempSrc: Oral                 [name]

## 2017-05-09 NOTE — Transfer of Care (Signed)
Immediate Anesthesia Transfer of Care Note  Patient: Gerald Valentine  Procedure(s) Performed: COLONOSCOPY WITH PROPOFOL (N/A ) BIOPSY  Patient Location: PACU  Anesthesia Type:MAC  Level of Consciousness: awake, alert  and oriented  Airway & Oxygen Therapy: Patient Spontanous Breathing  Post-op Assessment: Report given to RN  Post vital signs: Reviewed and stable  Last Vitals:  Vitals:   05/09/17 0720 05/09/17 0725  BP: 135/89   Resp: 15 15  Temp:    SpO2: 99% 99%    Last Pain:  Vitals:   05/09/17 0712  TempSrc: Oral         Complications: No apparent anesthesia complications

## 2017-05-09 NOTE — H&P (Signed)
Primary Care Physician:  Patient, No Pcp Per Primary Gastroenterologist:  Dr. Oneida Alar  Pre-Procedure History & Physical: HPI:  Gerald Valentine is a 32 y.o. male here for Crohn's screening.  Past Medical History:  Diagnosis Date  . Crohn's disease of both small and large intestine [facility])     Past Surgical History:  Procedure Laterality Date  . COLONOSCOPY    . COLONOSCOPY N/A 07/10/2015   Dr. Oneida Alar: stricture in distal ileum due to Crohn's. pseudopolyp in descending colon. rectum pathology with active colitis, left and ascending colon with quiescent colitis, rectum with active colitis. 6 month Surveillance  needed after starting Humira  (which was May 2017)   . None to date     As of 05/18/15    Prior to Admission medications   Medication Sig Start Date End Date Taking? Authorizing Provider  Na Sulfate-K Sulfate-Mg Sulf 17.5-3.13-1.6 GM/177ML SOLN Take 1 kit by mouth as directed. 03/29/17  Yes Leah Thornberry, Marga Melnick, MD  HUMIRA 40 MG/0.8ML PSKT INJECT 1 PREFILLED SYRINGE (40MG) SUBCUTANEOUSLY EVERY OTHER WEEK 12/20/16   Annitta Needs, NP    Allergies as of 03/29/2017  . (No Known Allergies)    Family History  Problem Relation Age of Onset  . Hypertension Mother   . Liver cancer Father        deceased age 31  . CVA Father   . Colon cancer Neg Hx     Social History   Socioeconomic History  . Marital status: Single    Spouse name: Not on file  . Number of children: Not on file  . Years of education: Not on file  . Highest education level: Not on file  Social Needs  . Financial resource strain: Not on file  . Food insecurity - worry: Not on file  . Food insecurity - inability: Not on file  . Transportation needs - medical: Not on file  . Transportation needs - non-medical: Not on file  Occupational History  . Not on file  Tobacco Use  . Smoking status: Never Smoker  . Smokeless tobacco: Never Used  Substance and Sexual Activity  . Alcohol use: No    Alcohol/week: 0.0 oz  .  Drug use: No  . Sexual activity: Yes    Birth control/protection: None  Other Topics Concern  . Not on file  Social History Narrative  . Not on file    Review of Systems: See HPI, otherwise negative ROS   Physical Exam: Temp 98.3 F (36.8 C) (Oral)   Ht 6' (1.829 m)   Wt 184 lb (83.5 kg)   BMI 24.95 kg/m  General:   Alert,  pleasant and cooperative in NAD Head:  Normocephalic and atraumatic. Neck:  Supple; Lungs:  Clear throughout to auscultation.    Heart:  Regular rate and rhythm. Abdomen:  Soft, nontender and nondistended. Normal bowel sounds, without guarding, and without rebound.   Neurologic:  Alert and  oriented x4;  grossly normal neurologically.  Impression/Plan:    SCREENING  Plan:  1. TCS TODAY.

## 2017-05-11 ENCOUNTER — Encounter (HOSPITAL_COMMUNITY): Payer: Self-pay | Admitting: Gastroenterology

## 2017-05-11 ENCOUNTER — Telehealth: Payer: Self-pay | Admitting: Gastroenterology

## 2017-05-11 NOTE — Telephone Encounter (Signed)
PLEASE CALL PT. HIS colon AND RECTAL biopsies are normal. HE HAD NO EVIDENCE FOR ACTIVE DISEASE AND ONLY HAD EVIDENCE FOR ACTIVE CROHN'S DISEASE IN THE PAST IN HIS SMALL BOWEL AS EVIDENCED BY THE STRICTURE.   REPEAT TCS IN 1-2 YEARS.

## 2017-05-11 NOTE — Telephone Encounter (Signed)
LMOM to call.

## 2017-05-12 NOTE — Telephone Encounter (Signed)
Pt is aware.  

## 2017-05-12 NOTE — Telephone Encounter (Signed)
Reminder in epic °

## 2017-05-15 ENCOUNTER — Other Ambulatory Visit: Payer: Self-pay

## 2017-05-15 ENCOUNTER — Other Ambulatory Visit: Payer: Self-pay | Admitting: Gastroenterology

## 2017-05-15 DIAGNOSIS — K50119 Crohn's disease of large intestine with unspecified complications: Secondary | ICD-10-CM

## 2017-08-23 ENCOUNTER — Encounter: Payer: Self-pay | Admitting: Gastroenterology

## 2017-11-20 ENCOUNTER — Telehealth: Payer: Self-pay

## 2017-11-20 NOTE — Telephone Encounter (Signed)
Fax received from [facility] saying they have made several attempts to contact pt to fill his Humira.  They said to notify them if there have been changes to his therapy of if we have updated contact info for pt.  I tried to call the pt @ [phone], the number they have and could not leave a message, VM full.

## 2017-11-21 MED ORDER — ADALIMUMAB 40 MG/0.8ML ~~LOC~~ PSKT: 1.0000 | PREFILLED_SYRINGE | SUBCUTANEOUS | 5 refills | Status: DC

## 2017-11-21 NOTE — Telephone Encounter (Signed)
I refilled Humira. We may need to send letter.

## 2017-11-21 NOTE — Telephone Encounter (Signed)
Letter mailed to pt to contact [facility] or give [place] a call.

## 2017-12-14 ENCOUNTER — Telehealth: Payer: Self-pay

## 2017-12-14 NOTE — Telephone Encounter (Signed)
I have done the PA for the Humira and it is approved. GPI/NDC: [id number].  Approved through 12/14/2018. Faxed to the pharmacy.   Paperwork to be scanned.

## 2017-12-18 ENCOUNTER — Ambulatory Visit: Payer: BLUE CROSS/BLUE SHIELD | Admitting: Nurse Practitioner

## 2018-03-20 ENCOUNTER — Telehealth: Payer: Self-pay

## 2018-03-20 NOTE — Telephone Encounter (Signed)
I received a fax from [facility] that they could not reach pt. I called pt and number is working, he said that he has been working a lot. I called [facility] and they would like pt to call them. Pt is aware and will do so.

## 2018-03-26 ENCOUNTER — Emergency Department (HOSPITAL_COMMUNITY): Payer: BLUE CROSS/BLUE SHIELD

## 2018-03-26 ENCOUNTER — Other Ambulatory Visit: Payer: Self-pay

## 2018-03-26 ENCOUNTER — Encounter (HOSPITAL_COMMUNITY): Payer: Self-pay | Admitting: Emergency Medicine

## 2018-03-26 ENCOUNTER — Inpatient Hospital Stay (HOSPITAL_COMMUNITY)
Admission: EM | Admit: 2018-03-26 | Discharge: 2018-03-28 | DRG: 387 | Disposition: A | Discharge status: Home / Self Care | Payer: BLUE CROSS/BLUE SHIELD | Attending: Internal Medicine | Admitting: Internal Medicine

## 2018-03-26 DIAGNOSIS — K50013 Crohn's disease of small intestine with fistula: Secondary | ICD-10-CM | POA: Diagnosis not present

## 2018-03-26 DIAGNOSIS — Z888 Allergy status to other drugs, medicaments and biological substances status: Secondary | ICD-10-CM

## 2018-03-26 DIAGNOSIS — K50819 Crohn's disease of both small and large intestine with unspecified complications: Secondary | ICD-10-CM | POA: Diagnosis not present

## 2018-03-26 DIAGNOSIS — Z8249 Family history of ischemic heart disease and other diseases of the circulatory system: Secondary | ICD-10-CM

## 2018-03-26 DIAGNOSIS — Z800 Family history of malignant neoplasm of digestive organs: Secondary | ICD-10-CM

## 2018-03-26 DIAGNOSIS — E876 Hypokalemia: Secondary | ICD-10-CM | POA: Diagnosis present

## 2018-03-26 DIAGNOSIS — Z823 Family history of stroke: Secondary | ICD-10-CM

## 2018-03-26 DIAGNOSIS — E860 Dehydration: Secondary | ICD-10-CM | POA: Diagnosis present

## 2018-03-26 DIAGNOSIS — D649 Anemia, unspecified: Secondary | ICD-10-CM | POA: Diagnosis present

## 2018-03-26 DIAGNOSIS — E869 Volume depletion, unspecified: Secondary | ICD-10-CM | POA: Diagnosis present

## 2018-03-26 LAB — COMPREHENSIVE METABOLIC PANEL
ALK PHOS: 73 U/L (ref 38–126)
ALT: 12 U/L (ref 0–44)
AST: 14 U/L — ABNORMAL LOW (ref 15–41)
Albumin: 3.1 g/dL — ABNORMAL LOW (ref 3.5–5.0)
Anion gap: 8 (ref 5–15)
BUN: 15 mg/dL (ref 6–20)
CALCIUM: 8.3 mg/dL — AB (ref 8.9–10.3)
CO2: 24 mmol/L (ref 22–32)
CREATININE: 1.25 mg/dL — AB (ref 0.61–1.24)
Chloride: 101 mmol/L (ref 98–111)
Glucose, Bld: 97 mg/dL (ref 70–99)
Potassium: 3.3 mmol/L — ABNORMAL LOW (ref 3.5–5.1)
SODIUM: 133 mmol/L — AB (ref 135–145)
Total Bilirubin: 1 mg/dL (ref 0.3–1.2)
Total Protein: 7.7 g/dL (ref 6.5–8.1)

## 2018-03-26 LAB — CBC WITH DIFFERENTIAL/PLATELET
Abs Immature Granulocytes: 0.02 10*3/uL (ref 0.00–0.07)
BASOS ABS: 0 10*3/uL (ref 0.0–0.1)
Basophils Relative: 0 %
EOS ABS: 0 10*3/uL (ref 0.0–0.5)
Eosinophils Relative: 0 %
HEMATOCRIT: 34.9 % — AB (ref 39.0–52.0)
Hemoglobin: 10.9 g/dL — ABNORMAL LOW (ref 13.0–17.0)
Immature Granulocytes: 0 %
LYMPHS ABS: 1.1 10*3/uL (ref 0.7–4.0)
Lymphocytes Relative: 14 %
MCH: 27.7 pg (ref 26.0–34.0)
MCHC: 31.2 g/dL (ref 30.0–36.0)
MCV: 88.8 fL (ref 80.0–100.0)
Monocytes Absolute: 1 10*3/uL (ref 0.1–1.0)
Monocytes Relative: 12 %
NRBC: 0 % (ref 0.0–0.2)
Neutro Abs: 6.1 10*3/uL (ref 1.7–7.7)
Neutrophils Relative %: 74 %
Platelets: 352 10*3/uL (ref 150–400)
RBC: 3.93 MIL/uL — ABNORMAL LOW (ref 4.22–5.81)
RDW: 12.7 % (ref 11.5–15.5)
WBC: 8.3 10*3/uL (ref 4.0–10.5)

## 2018-03-26 LAB — URINALYSIS, ROUTINE W REFLEX MICROSCOPIC
Bilirubin Urine: NEGATIVE
GLUCOSE, UA: NEGATIVE mg/dL
Ketones, ur: 5 mg/dL — AB
Leukocytes, UA: NEGATIVE
NITRITE: NEGATIVE
PH: 6 (ref 5.0–8.0)
Protein, ur: >=300 mg/dL — AB
RBC / HPF: >50 RBC/hpf — ABNORMAL HIGH (ref 0–5)
SPECIFIC GRAVITY, URINE: 1.027 (ref 1.005–1.030)

## 2018-03-26 LAB — LACTIC ACID, PLASMA: Lactic Acid, Venous: 0.6 mmol/L (ref 0.5–1.9)

## 2018-03-26 LAB — LIPASE, BLOOD: Lipase: 25 U/L (ref 11–51)

## 2018-03-26 MED ORDER — IOPAMIDOL (ISOVUE-300) INJECTION 61%
100.0000 mL | Freq: Once | INTRAVENOUS | Status: AC | PRN
Start: 2018-03-26 — End: 2018-03-27
  Administered 2018-03-27: 100 mL via INTRAVENOUS

## 2018-03-26 MED ORDER — SODIUM CHLORIDE 0.9 % IV BOLUS
1000.0000 mL | Freq: Once | INTRAVENOUS | Status: AC
  Administered 2018-03-26: 1000 mL via INTRAVENOUS

## 2018-03-26 MED ORDER — IOPAMIDOL (ISOVUE-300) INJECTION 61%
30.0000 mL | Freq: Once | INTRAVENOUS | Status: AC | PRN
  Administered 2018-03-26: 30 mL via ORAL

## 2018-03-26 MED ORDER — ACETAMINOPHEN 500 MG PO TABS
1000.0000 mg | ORAL_TABLET | Freq: Once | ORAL | Status: AC
  Administered 2018-03-26: 1000 mg via ORAL
  Filled 2018-03-26: qty 2

## 2018-03-26 NOTE — ED Triage Notes (Signed)
Pt C/O dysuria and pressure in his bladder. Pt states he has had a fever for about 1 week. Pt states he has been to urgent care and had his "urine tested" and it was normal. Pt states he has also had lab work done "on my kidneys" and it was normal.

## 2018-03-26 NOTE — ED Provider Notes (Signed)
[facility] EMERGENCY DEPARTMENT Provider Note   CSN: [id number] Arrival date & time: 03/26/18  2151     History   Chief Complaint Chief Complaint  Patient presents with  . Dysuria    HPI Gerald Valentine is a 32 y.o. male.  HPI   Gerald Valentine is a 32 y.o. male with history of Crohn's disease, who presents to the Emergency Department complaining of intermittent fevers for 1 week.  He states his fever at home has been as high as 101, improves after taking over-the-counter analgesics.  He complains of burning and pressure with urination and pain in his lower abdomen.  He does not feel like he is fully emptying his bladder.  Symptoms have been present for several days.  He states he was seen in a local urgent care and his urine was tested and he was told that it was no evidence of a infection.  He was later seen at a emergency room in [facility], but states that again he was told that his blood work in urinalysis were normal.  This evening, he states the pressure in his lower abdomen is worse and he developed a fever while at work which prompted him to return for evaluation.  He denies vomiting, diarrhea, back pain.  He also denies new sexual partners, pain and swelling of his testicles or penis.  No penile discharge.  No previous history of kidney stones or frequent UTIs.    Past Medical History:  Diagnosis Date  . Crohn's disease of both small and large intestine [facility])     Patient Active Problem List   Diagnosis Date Noted  . Crohn's disease of colon with intestinal obstruction ([place])   . Crohn's disease of both small and large intestine with complication ([place]) 97/35/3299    Past Surgical History:  Procedure Laterality Date  . BIOPSY  05/09/2017   Procedure: BIOPSY;  Surgeon: [name], MD;  Location: [facility];  Service: Endoscopy;;  colon  . COLONOSCOPY    . COLONOSCOPY N/A 07/10/2015   Dr. Oneida Alar: stricture in distal ileum due to Crohn's. pseudopolyp in descending  colon. rectum pathology with active colitis, left and ascending colon with quiescent colitis, rectum with active colitis. 6 month Surveillance  needed after starting Humira  (which was May 2017)   . COLONOSCOPY WITH PROPOFOL N/A 05/09/2017   Procedure: COLONOSCOPY WITH PROPOFOL;  Surgeon: [name], MD;  Location: [facility] ENDO SU[facility];  Service: Endoscopy;  Laterality: N/A;  7:30am  . None to date     As of 05/18/15      Home Medications    Prior to Admission medications   Medication Sig Start Date End Date Taking? Authorizing Provider  Adalimumab (HUMIRA) 40 MG/0.8ML PSKT Inject 0.8 mLs (40 mg total) into the skin every 14 (fourteen) days. 11/21/17  Yes Annitta Needs, NP  oseltamivir (TAMIFLU) 75 MG capsule Take 75 mg by mouth 2 (two) times daily. 5 day course starting on 03/25/2018 03/25/18  Yes [provider]    Family History Family History  Problem Relation Age of Onset  . Hypertension Mother   . Liver cancer Father        deceased age 11  . CVA Father   . Colon cancer Neg Hx     Social History Social History   Tobacco Use  . Smoking status: Never Smoker  . Smokeless tobacco: Never Used  Substance Use Topics  . Alcohol use: No    Alcohol/week: 0.0  standard drinks  . Drug use: No     Allergies   Remicade [infliximab]   Review of Systems Review of Systems  Constitutional: Positive for fever. Negative for activity change, appetite change and chills.  HENT: Negative for congestion and trouble swallowing.   Respiratory: Negative for chest tightness and shortness of breath.   Cardiovascular: Negative for chest pain.  Gastrointestinal: Positive for abdominal pain. Negative for diarrhea, nausea and vomiting.  Genitourinary: Positive for decreased urine volume, dysuria and frequency. Negative for difficulty urinating, discharge, flank pain, hematuria, penile pain, penile swelling, scrotal swelling, testicular pain and urgency.  Musculoskeletal: Negative for back  pain and myalgias.  Skin: Negative for rash.  Neurological: Negative for dizziness, weakness and numbness.  Hematological: Negative for adenopathy.  Psychiatric/Behavioral: Negative for confusion.     Physical Exam Updated Vital Signs BP (!) 152/98 (BP Location: Right Arm)   Pulse (!) 120   Temp (!) 101.2 F (38.4 C) (Oral)   Resp 14   Ht 6' (1.829 m)   Wt 78 kg   SpO2 96%   BMI 23.33 kg/m   Physical Exam  Constitutional: No distress.  HENT:  Head: Atraumatic.  Mouth/Throat: Oropharynx is clear and moist.  Neck: Normal range of motion. Neck supple.  Cardiovascular: Regular rhythm. Tachycardia present.  Pulmonary/Chest: Effort normal and breath sounds normal. No respiratory distress. He exhibits no tenderness.  Abdominal: Soft. He exhibits no distension and no mass. There is tenderness. There is no rebound and no guarding.  Suprapubic tenderness on exam.  No CVA tenderness, no guarding or rebound tenderness.  Musculoskeletal: Normal range of motion.  Lymphadenopathy:    He has no cervical adenopathy.  Neurological: He is alert. No sensory deficit.  Skin: Skin is warm. Capillary refill takes less than 2 seconds. No rash noted.  Nursing note and vitals reviewed.    ED Treatments / Results  Labs (all labs ordered are listed, but only abnormal results are displayed) Labs Reviewed  URINALYSIS, ROUTINE W REFLEX MICROSCOPIC - Abnormal; Notable for the following components:      Result Value   Color, Urine AMBER (*)    APPearance HAZY (*)    Hgb urine dipstick MODERATE (*)    Ketones, ur 5 (*)    Protein, ur >=300 (*)    RBC / HPF >50 (*)    Bacteria, UA RARE (*)    All other components within normal limits  COMPREHENSIVE METABOLIC PANEL - Abnormal; Notable for the following components:   Sodium 133 (*)    Potassium 3.3 (*)    Creatinine, Ser 1.25 (*)    Calcium 8.3 (*)    Albumin 3.1 (*)    AST 14 (*)    All other components within normal limits  CBC WITH  DIFFERENTIAL/PLATELET - Abnormal; Notable for the following components:   RBC 3.93 (*)    Hemoglobin 10.9 (*)    HCT 34.9 (*)    All other components within normal limits  URINE CULTURE  LIPASE, BLOOD  LACTIC ACID, PLASMA  LACTIC ACID, PLASMA    EKG None  Radiology No results found.  Procedures Procedures (including critical care time)  Medications Ordered in ED Medications  sodium chloride 0.9 % bolus 1,000 mL (has no administration in time range)  acetaminophen (TYLENOL) tablet 1,000 mg (1,000 mg Oral Given 03/26/18 2212)     Initial Impression / Assessment and Plan / ED Course  I have reviewed the triage vital signs and the nursing notes.  Pertinent labs & imaging results that were available during my care of the patient were reviewed by me and considered in my medical decision making (see chart for details).      Pt with tachycardia, fever, no hypotension.  He is non-toxic appearing.  With hx of Crohn's I am concerned that this is a possible underlying abscess.  Will obtain labs, urine culture and CT abdomen pelvis.  0135  CT pending, pt resting comfortably.  Vitals improved.  Discussed with Dr. Dina Rich who agrees to review CT results and arrange dispo  Final Clinical Impressions(s) / ED Diagnoses   Final diagnoses:  None    ED Discharge Orders    None       Kem Parkinson, PA-C 03/27/18 0159    Merryl Hacker, MD 03/27/18 [phone]

## 2018-03-26 NOTE — Progress Notes (Signed)
Patient given oral contrast to drink beginning at 2300.  Pt to be scanned at 0100.

## 2018-03-27 ENCOUNTER — Ambulatory Visit: Payer: BLUE CROSS/BLUE SHIELD | Admitting: Nurse Practitioner

## 2018-03-27 ENCOUNTER — Encounter (HOSPITAL_COMMUNITY): Payer: Self-pay | Admitting: Internal Medicine

## 2018-03-27 DIAGNOSIS — E860 Dehydration: Secondary | ICD-10-CM | POA: Diagnosis present

## 2018-03-27 DIAGNOSIS — K50013 Crohn's disease of small intestine with fistula: Secondary | ICD-10-CM | POA: Diagnosis present

## 2018-03-27 DIAGNOSIS — K50819 Crohn's disease of both small and large intestine with unspecified complications: Secondary | ICD-10-CM | POA: Diagnosis present

## 2018-03-27 DIAGNOSIS — Z8249 Family history of ischemic heart disease and other diseases of the circulatory system: Secondary | ICD-10-CM | POA: Diagnosis not present

## 2018-03-27 DIAGNOSIS — Z800 Family history of malignant neoplasm of digestive organs: Secondary | ICD-10-CM | POA: Diagnosis not present

## 2018-03-27 DIAGNOSIS — D649 Anemia, unspecified: Secondary | ICD-10-CM | POA: Diagnosis present

## 2018-03-27 DIAGNOSIS — E876 Hypokalemia: Secondary | ICD-10-CM | POA: Diagnosis present

## 2018-03-27 DIAGNOSIS — E869 Volume depletion, unspecified: Secondary | ICD-10-CM | POA: Diagnosis present

## 2018-03-27 DIAGNOSIS — Z888 Allergy status to other drugs, medicaments and biological substances status: Secondary | ICD-10-CM | POA: Diagnosis not present

## 2018-03-27 DIAGNOSIS — Z823 Family history of stroke: Secondary | ICD-10-CM | POA: Diagnosis not present

## 2018-03-27 LAB — RETICULOCYTES
IMMATURE RETIC FRACT: 12.1 % (ref 2.3–15.9)
RBC.: 3.75 MIL/uL — ABNORMAL LOW (ref 4.22–5.81)
Retic Count, Absolute: 23.6 10*3/uL (ref 19.0–186.0)
Retic Ct Pct: 0.6 % (ref 0.4–3.1)

## 2018-03-27 LAB — CBC WITH DIFFERENTIAL/PLATELET
Abs Immature Granulocytes: 0.03 10*3/uL (ref 0.00–0.07)
BASOS ABS: 0 10*3/uL (ref 0.0–0.1)
Basophils Relative: 0 %
Eosinophils Absolute: 0 10*3/uL (ref 0.0–0.5)
Eosinophils Relative: 1 %
HCT: 34.6 % — ABNORMAL LOW (ref 39.0–52.0)
Hemoglobin: 10.8 g/dL — ABNORMAL LOW (ref 13.0–17.0)
Immature Granulocytes: 0 %
Lymphocytes Relative: 18 %
Lymphs Abs: 1.4 10*3/uL (ref 0.7–4.0)
MCH: 28.6 pg (ref 26.0–34.0)
MCHC: 31.2 g/dL (ref 30.0–36.0)
MCV: 91.5 fL (ref 80.0–100.0)
Monocytes Absolute: 1.1 10*3/uL — ABNORMAL HIGH (ref 0.1–1.0)
Monocytes Relative: 14 %
NEUTROS PCT: 67 %
NRBC: 0 % (ref 0.0–0.2)
Neutro Abs: 5.2 10*3/uL (ref 1.7–7.7)
PLATELETS: 322 10*3/uL (ref 150–400)
RBC: 3.78 MIL/uL — ABNORMAL LOW (ref 4.22–5.81)
RDW: 13 % (ref 11.5–15.5)
WBC: 7.8 10*3/uL (ref 4.0–10.5)

## 2018-03-27 LAB — FOLATE: FOLATE: 7.4 ng/mL (ref >5.9)

## 2018-03-27 LAB — LACTIC ACID, PLASMA: Lactic Acid, Venous: 0.6 mmol/L (ref 0.5–1.9)

## 2018-03-27 LAB — IRON AND TIBC
Iron: 13 ug/dL — ABNORMAL LOW (ref 45–182)
Saturation Ratios: 10 % — ABNORMAL LOW (ref 17.9–39.5)
TIBC: 129 ug/dL — ABNORMAL LOW (ref 250–450)
UIBC: 116 ug/dL

## 2018-03-27 LAB — VITAMIN B12: Vitamin B-12: 215 pg/mL (ref 180–914)

## 2018-03-27 LAB — MAGNESIUM: Magnesium: 2 mg/dL (ref 1.7–2.4)

## 2018-03-27 LAB — C-REACTIVE PROTEIN: CRP: 21 mg/dL — AB (ref <1.0)

## 2018-03-27 LAB — FERRITIN: Ferritin: 293 ng/mL (ref 24–336)

## 2018-03-27 LAB — PHOSPHORUS: Phosphorus: 2.8 mg/dL (ref 2.5–4.6)

## 2018-03-27 MED ORDER — ONDANSETRON HCL 4 MG PO TABS: 4.0000 mg | ORAL_TABLET | Freq: Four times a day (QID) | ORAL | Status: DC | PRN

## 2018-03-27 MED ORDER — POTASSIUM CHLORIDE IN NACL 20-0.9 MEQ/L-% IV SOLN: INTRAVENOUS | Status: AC

## 2018-03-27 MED ORDER — MORPHINE SULFATE (PF) 4 MG/ML IV SOLN: 4.0000 mg | Freq: Once | INTRAVENOUS | Status: DC

## 2018-03-27 MED ORDER — METHYLPREDNISOLONE SODIUM SUCC 125 MG IJ SOLR
60.0000 mg | INTRAMUSCULAR | Status: DC
  Administered 2018-03-27 – 2018-03-28 (×2): 60 mg via INTRAVENOUS
  Filled 2018-03-27 (×2): qty 2

## 2018-03-27 MED ORDER — PIPERACILLIN-TAZOBACTAM 3.375 G IVPB
3.3750 g | Freq: Three times a day (TID) | INTRAVENOUS | Status: DC
  Administered 2018-03-27 – 2018-03-28 (×4): 3.375 g via INTRAVENOUS
  Filled 2018-03-27 (×4): qty 50

## 2018-03-27 MED ORDER — ONDANSETRON HCL 4 MG/2ML IJ SOLN: 4.0000 mg | Freq: Four times a day (QID) | INTRAMUSCULAR | Status: DC | PRN

## 2018-03-27 MED ORDER — BUDESONIDE 3 MG PO CPEP
9.0000 mg | ORAL_CAPSULE | Freq: Every day | ORAL | Status: DC
  Administered 2018-03-27 – 2018-03-28 (×2): 9 mg via ORAL
  Filled 2018-03-27 (×4): qty 3

## 2018-03-27 MED ORDER — HEPARIN SODIUM (PORCINE) 5000 UNIT/ML IJ SOLN
5000.0000 [IU] | Freq: Three times a day (TID) | INTRAMUSCULAR | Status: DC
  Administered 2018-03-27 – 2018-03-28 (×4): 5000 [IU] via SUBCUTANEOUS
  Filled 2018-03-27 (×4): qty 1

## 2018-03-27 MED ORDER — PIPERACILLIN-TAZOBACTAM 3.375 G IVPB 30 MIN
3.3750 g | Freq: Once | INTRAVENOUS | Status: AC
  Administered 2018-03-27: 3.375 g via INTRAVENOUS
  Filled 2018-03-27: qty 50

## 2018-03-27 MED ORDER — SODIUM CHLORIDE 0.9 % IV SOLN: Freq: Once | INTRAVENOUS | Status: DC

## 2018-03-27 MED ORDER — ACETAMINOPHEN 325 MG PO TABS: 650.0000 mg | ORAL_TABLET | Freq: Four times a day (QID) | ORAL | Status: DC | PRN

## 2018-03-27 MED ORDER — POTASSIUM CHLORIDE IN NACL 20-0.9 MEQ/L-% IV SOLN
INTRAVENOUS | Status: DC
  Administered 2018-03-27 – 2018-03-28 (×4): via INTRAVENOUS

## 2018-03-27 MED ORDER — MORPHINE SULFATE (PF) 4 MG/ML IV SOLN: 4.0000 mg | INTRAVENOUS | Status: DC | PRN

## 2018-03-27 MED ORDER — ACETAMINOPHEN 650 MG RE SUPP: 650.0000 mg | Freq: Four times a day (QID) | RECTAL | Status: DC | PRN

## 2018-03-27 MED ORDER — ONDANSETRON HCL 4 MG/2ML IJ SOLN: 4.0000 mg | Freq: Once | INTRAMUSCULAR | Status: DC

## 2018-03-27 MED ORDER — KETOROLAC TROMETHAMINE 30 MG/ML IJ SOLN
30.0000 mg | Freq: Once | INTRAMUSCULAR | Status: AC
  Administered 2018-03-27: 30 mg via INTRAVENOUS
  Filled 2018-03-27: qty 1

## 2018-03-27 NOTE — Progress Notes (Signed)
[facility]  Consult placed for surgery and GI.  32 yo with a history of Crohn's since the age of 78 who was previously treated with Pentasa and Remicade but developed an allergy. He is a patient of [facility] and was without treatment for several years.  He had a colonoscopy by Dr. Oneida Alar May 2017 with a strictured distal ileum/ ileocecal valve, active colitis in his small bowel, colon and rectum.  He was rescoped January 2019 and it sounds like he was on Humira, and was noted to only have a stricture at the IC valve and no ulcerations or active colonic disease.  He was then lost again to follow up and per report from GI may not of been refilling his Humira.    He presented to the hospital with fevers, burning with urination, fatigue, and was tachycardic and febrile to 101.2.  He was admitted and started on Zosyn overnight. 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    GI has seen him today and have advanced his diet. He denied any abdominal pain, rectal bleeding, etc.  He denied missing Humeria dosing. He is feeling better.  CT reviewed- thickened ileum and fistula track to sigmoid appreciated, no abscesses or perforation- all consistent with Crohn's   Discussed patient with GI, Roseanne Kaufman. 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 At this time no emergent need for operation and needs medical management of UTI and Crohn's disease.  Recommend referral to [facility] for further discussion regarding need for surgery which he may ultimately need given the colonoscopy findings of stricture and the fistula, but this is not an emergency and can be done as an outpatient.  If something changes for the patient, please re-consult. Discussed with Dr. Dyann Kief.   Curlene Labrum, MD [facility] [address] [place], [place] [address] [phone] (office)

## 2018-03-27 NOTE — Progress Notes (Signed)
Patient seen and examined.  Admitted after midnight secondary to burning with urination, ongoing fevers and general malaise.  Patient has been seen in urgent cares and different ED at different hospital over the last 2 weeks with the symptoms.  During his last visit they thought patient had influenza and was empirically started on Tamiflu.  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  There is no signs of perforation, free air inside his bladder or any other acute surgical emergency findings.  Patient has been started on IV fluids and empiric Zosyn.  He currently denies nausea, vomiting and is feeling better.  Vital signs are stable.  After discussing with GI service they will check Humira antibodies and make final decision regarding initiation of steroids.  Please refer to H&P written by Dr. Olevia Bowens on 03/27/2018 for further info/details on admission.   Plan -Continue fluid resuscitation -Slowly advance diet -Follow GI recommendations regarding initiation of steroids -Follow results of [facility] panel to further assess efficacy and further treatment with Humira.  Barton Dubois MD [phone]

## 2018-03-27 NOTE — Consult Note (Signed)
Referring Provider: Dr. Olevia Bowens  Primary Care Physician:  Patient, No Pcp Per Primary Gastroenterologist:  Dr. Oneida Alar   Date of Admission: 03/26/18  Date of Consultation: 03/27/18  Reason for Consultation: Crohn's disease   HPI:  Gerald Valentine is a 32 y.o. year old male initially diagnosed with Crohn's ileitis at age 89 at [facility], previously on Pentasa in the remote past until 2015, then Remicade but developed allergic reaction, presenting to [facility] in Jan 2017 without any therapy for several years. Humira was started in May 2017, following colonoscopy by Dr. Oneida Alar showing stricture in distal ileum due to Crohn's. pseudopolyp in descending colon. rectum pathology with active colitis, left and ascending colon with quiescent colitis, rectum with active colitis. Most recent colonoscopy Jan 2019 with stricture at IC valve, redundant colon, no ulcerations visibly seen at time of colonoscopy, s/p multiple biopsies of colon with benign colonic mucosa. He has not been seen this that time.   Presented with fever for several weeks, burning dysuria, fatigue, body aches, and night sweats. On admission, fever 101.2, tachycardic. Empirically started on Zosyn. Hgb 10.9, decline from Jan 2019 when in the 12 range, which has been patient's baseline. Anemia panel pending. 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 Unable to exclude enterovesicular fistula. No abscess.   He states he has had loss of appetite for about a week. No abdominal pain but did note dysuria. No N/V. Fever 100-101 at home for several weeks. Dysuria for several days. Went to Urgent Care Saturday and tested negative for flu. Went to [facility] on Sunday, but states no imaging was completed. Decided to present to [facility] due to persisting general malaise,  fever, dysuria. Afebrile today. Denies NSAID intake. Actually due for Humira dose tomorrow. Denies skipping any doses. BM once daily. No rectal bleeding. Denies any issues since time of colonoscopy Jan 2019 until now. No recent prednisone needed. Last rounds of prednisone were in 2017 prior to starting Humira.   Feeling improved today. Would like to try clear liquids. Dysuria improved. Some question of missed dosing when reviewing phone notes from our office, as specialty pharmacy unable to reach him. He denies missed doses.     Past Medical History:  Diagnosis Date  . Crohn's disease of both small and large intestine [facility])     Past Surgical History:  Procedure Laterality Date  . BIOPSY  05/09/2017   Procedure: BIOPSY;  Surgeon: [name], MD;  Location: [facility];  Service: Endoscopy;;  colon  . COLONOSCOPY    . COLONOSCOPY N/A 07/10/2015   Dr. Oneida Alar: stricture in distal ileum due to Crohn's. pseudopolyp in descending colon. rectum pathology with active colitis, left and ascending colon with quiescent colitis, rectum with active colitis. 6 month Surveillance  needed after starting Humira  (which was May 2017)   . COLONOSCOPY WITH PROPOFOL N/A 05/09/2017   stricture at IC valve, redundant colon, no colonic ulcerations visibly seen, multiple biopsies of colon with benign colonic mucosa  . None to date     As of 05/18/15    Prior to Admission medications   Medication Sig Start Date End Date Taking? Authorizing Provider  Adalimumab (HUMIRA) 40 MG/0.8ML PSKT Inject 0.8 mLs (40 mg total) into the skin every 14 (fourteen) days. 11/21/17  Yes Annitta Needs, NP  oseltamivir (TAMIFLU) 75 MG  capsule Take 75 mg by mouth 2 (two) times daily. 5 day course starting on 03/25/2018 03/25/18  Yes [provider]    Current Facility-Administered Medications  Medication Dose Route Frequency Provider Last Rate Last Dose  . 0.9 % NaCl with KCl 20 mEq/ L  infusion   Intravenous Continuous  Reubin Milan, MD 125 mL/hr at 03/27/18 [phone]    . acetaminophen (TYLENOL) tablet 650 mg  650 mg Oral Q6H PRN Reubin Milan, MD       Or  . acetaminophen (TYLENOL) suppository 650 mg  650 mg Rectal Q6H PRN Reubin Milan, MD      . heparin injection 5,000 Units  5,000 Units Subcutaneous Q8H Reubin Milan, MD      . morphine 4 MG/ML injection 4 mg  4 mg Intravenous Once Reubin Milan, MD      . morphine 4 MG/ML injection 4 mg  4 mg Intravenous Q3H PRN Reubin Milan, MD      . ondansetron [facility]) injection 4 mg  4 mg Intravenous Once Reubin Milan, MD      . ondansetron [facility]) tablet 4 mg  4 mg Oral Q6H PRN Reubin Milan, MD       Or  . ondansetron [facility]) injection 4 mg  4 mg Intravenous Q6H PRN Reubin Milan, MD      . piperacillin-tazobactam (ZOSYN) IVPB 3.375 g  3.375 g Intravenous Q8H Reubin Milan, MD        Allergies as of 03/26/2018 - Review Complete 03/26/2018  Allergen Reaction Noted  . Remicade [infliximab] Shortness Of Breath 05/11/2017    Family History  Problem Relation Age of Onset  . Hypertension Mother   . Liver cancer Father        deceased age 28  . CVA Father   . Colon cancer Neg Hx     Social History   Socioeconomic History  . Marital status: Single    Spouse name: Not on file  . Number of children: Not on file  . Years of education: Not on file  . Highest education level: Not on file  Occupational History  . Not on file  Social Needs  . Financial resource strain: Not on file  . Food insecurity:    Worry: Not on file    Inability: Not on file  . Transportation needs:    Medical: Not on file    Non-medical: Not on file  Tobacco Use  . Smoking status: Never Smoker  . Smokeless tobacco: Never Used  Substance and Sexual Activity  . Alcohol use: No    Alcohol/week: 0.0 standard drinks  . Drug use: No  . Sexual activity: Yes    Birth control/protection: None  Lifestyle  . Physical  activity:    Days per week: Not on file    Minutes per session: Not on file  . Stress: Not on file  Relationships  . Social connections:    Talks on phone: Not on file    Gets together: Not on file    Attends religious service: Not on file    Active member of club or organization: Not on file    Attends meetings of clubs or organizations: Not on file    Relationship status: Not on file  . Intimate partner violence:    Fear of current or ex partner: Not on file    Emotionally abused: Not on file    Physically abused: Not on  file    Forced sexual activity: Not on file  Other Topics Concern  . Not on file  Social History Narrative  . Not on file    Review of Systems: Gen: see HPI  CV: Denies chest pain, heart palpitations, syncope, edema  Resp: Denies shortness of breath with rest, cough, wheezing GI: see HP I  GU : Denies urinary burning, urinary frequency, urinary incontinence.  MS: Denies joint pain,swelling, cramping Derm: Denies rash, itching, dry skin Psych: Denies depression, anxiety,confusion, or memory loss Heme: Denies bruising, bleeding, and enlarged lymph nodes.  Physical Exam: Vital signs in last 24 hours: Temp:  [98.9 F (37.2 C)-101.2 F (38.4 C)] 98.9 F (37.2 C) (12/03 0355) Pulse Rate:  [78-120] 78 (12/03 0355) Resp:  [14] 14 (12/02 2157) BP: (126-152)/(77-98) 137/94 (12/03 0355) SpO2:  [96 %-100 %] 100 % (12/03 0355) Weight:  [78 kg] 78 kg (12/02 2158)   General:   Alert,  Well-developed, well-nourished, pleasant and cooperative in NAD Head:  Normocephalic and atraumatic. Eyes:  Sclera clear, no icterus.   Conjunctiva pink. Ears:  Normal auditory acuity. Nose:  No deformity, discharge,  or lesions. Mouth:  No deformity or lesions, dentition normal. Lungs:  Clear throughout to auscultation.   Heart:  S1 S2 present without murmurs  Abdomen:  Soft, nontender and nondistended. No masses, hepatosplenomegaly or hernias noted. Normal bowel sounds,  without guarding, and without rebound.   Rectal:  Deferred  Msk:  Symmetrical without gross deformities. Normal posture. Extremities:  Without edema. Neurologic:  Alert and  oriented x4 Skin:  Intact without significant lesions or rashes. Psych:  Alert and cooperative. Normal mood and affect.  Intake/Output from previous day: 12/02 0701 - 12/03 0700 In: 1040.7 [IV Piggyback:1040.7] Out: 39 [Urine:36] Intake/Output this shift: No intake/output data recorded.  Lab Results: Recent Labs    03/26/18 2259 03/27/18 0451  WBC 8.3 7.8  HGB 10.9* 10.8*  HCT 34.9* 34.6*  PLT 352 322   BMET Recent Labs    03/26/18 2259  NA 133*  K 3.3*  CL 101  CO2 24  GLUCOSE 97  BUN 15  CREATININE 1.25*  CALCIUM 8.3*   LFT Recent Labs    03/26/18 2259  PROT 7.7  ALBUMIN 3.1*  AST 14*  ALT 12  ALKPHOS 73  BILITOT 1.0    Studies/Results: Ct Abdomen Pelvis W Contrast  Result Date: 03/27/2018 CLINICAL DATA:  Dysuria.  History of Crohn's disease.  Fever. EXAM: CT ABDOMEN AND PELVIS WITH CONTRAST TECHNIQUE: Multidetector CT imaging of the abdomen and pelvis was performed using the standard protocol following bolus administration of intravenous contrast. CONTRAST:  143m ISOVUE-300 IOPAMIDOL (ISOVUE-300) INJECTION 61% COMPARISON:  06/03/2015 FINDINGS: Lower chest: Lung bases are clear. No effusions. Heart is normal size. Hepatobiliary: No focal hepatic abnormality. Gallbladder unremarkable. Pancreas: No focal abnormality or ductal dilatation. Spleen: No focal abnormality.  Normal size. Adrenals/Urinary Tract: Marked bladder wall thickening along the superior bladder wall this is likely secondarily inflamed from inflamed adjacent bowel. Fistula described below courses near the superior bladder wall. It is difficult to completely exclude this extends and involves the bladder wall superiorly. No hydronephrosis. Adrenal glands unremarkable. Stomach/Bowel: Markedly inflamed and thickened terminal  ileum. There appears to be a fistulous tract extending from the terminal ileum posteriorly to the sigmoid colon. This fistulous tract is also near the superior wall of the bladder which is markedly thickened. No evidence of bowel obstruction. Vascular/Lymphatic: No evidence of aneurysm or adenopathy. Reproductive: No visible  focal abnormality. Other: Small amount of free fluid in the pelvis.  No free air. Musculoskeletal: No acute bony abnormality. IMPRESSION: Markedly abnormal terminal ileum with wall thickening and inflammation. There appears to be a fistulous tract which extends posteriorly from the terminal ileum to the sigmoid colon. This passes near the superior wall of the urinary bladder which is markedly thickened secondarily. It is difficult to exclude involvement of the superior bladder wall with this fistulous tract. No visible intraluminal gas within the bladder. No evidence of bowel obstruction. Small amount of free fluid in the pelvis. No focal fluid collection/abscess. Electronically Signed   By: Rolm Baptise M.D.   On: 03/27/2018 01:49    Impression: 32 year old pleasant male with long-standing history of Crohn's ileocolitis diagnosed at age 7, with last colonoscopy Jan 2019 revealing ileal stricture and no evidence for active colonic disease on Humira. Previously failing mesalamine in remote past and had reaction to Remicade with second dosing, now on Humira since May 2017 and had been doing well. 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 Although no leukocytosis, he is chronically immunosuppressed. Clinically, he has improved since admission with empiric IV antibiotics, supportive measures. Abdominal exam is benign. Urine culture pending.   As his next Humira dose is due tomorrow, will take this opportunity to order trough level and antibodies. Would benefit from IV  steroid dosing and transitioning to oral once tolerating diet. Agree with surgical consult just to have on board, although no acute indication for surgery at this time. May benefit from tertiary referral as outpatient, depending on results from Humira testing and clinical course. As of note, there was some question of breaks in therapy from review of phone notes, as specialty pharmacy had difficulty reaching him. Denies missing any Humira dosing.   Normocytic anemia: Multifactorial in setting of acute illness, chronic disease, with hgb declined from baseline. Ferritin normal but may be falsely elevated in setting of acute illness. Iron low.   Plan: Advance to clear liquids Continue IV antibiotics Check Humira trough level and antibodies today, as due for next dose tomorrow Awaiting surgical input  Urine culture pending Discussing IV glucocorticoids with transition to oral once tolerating diet as no evidence for abscess or other contraindication with Dr. Gala Romney.     Annitta Needs, PhD, ANP-BC [facility] Gastroenterology     LOS: 0 days    03/27/2018, 9:26 AM

## 2018-03-27 NOTE — Progress Notes (Deleted)
Referring Provider: No ref. provider found Primary Care Physician:  Patient, No Pcp Per Primary GI:  Dr.   No chief complaint on file.   HPI:   Gerald Valentine is a 32 y.o. male who presents for 52-monthfollow-up.  The patient was last seen in our office 03/29/2017 for Crohn's disease.  At that time he was noted to be doing well, no flares since last visit with a good appetite.  Regular/normal bowel movements.  Denies fever, chills, hematochezia.  Dean recurrent disease well controlled on Humira, initially diagnosed age 32  Recommended colonoscopy, continue Humira, but the flu shot, labs in February 2019 and follow-up in 6 months.  Colonoscopy was completed 05/09/2017 which found a stricture at the ileocecal valve, simple endoscopic score for Crohn's disease of 1 indicative of mucosal inflammatory changes secondary to Crohn's disease in remission, status post biopsy.  Otherwise normal.  Surgical pathology found the biopsies to be benign colonic mucosa without significant inflammation or other pathology.  Recommended repeat colonoscopy in 1 to 3 years with ultra slim scope for surveillance.    Past Medical History:  Diagnosis Date  . Crohn's disease of both small and large intestine ([facility]     Past Surgical History:  Procedure Laterality Date  . BIOPSY  05/09/2017   Procedure: BIOPSY;  Surgeon: FDanie Binder MD;  Location: [facility];  Service: Endoscopy;;  colon  . COLONOSCOPY    . COLONOSCOPY N/A 07/10/2015   Dr. FOneida Alar stricture in distal ileum due to Crohn's. pseudopolyp in descending colon. rectum pathology with active colitis, left and ascending colon with quiescent colitis, rectum with active colitis. 6 month Surveillance  needed after starting Humira  (which was May 2017)   . COLONOSCOPY WITH PROPOFOL N/A 05/09/2017   Procedure: COLONOSCOPY WITH PROPOFOL;  Surgeon: FDanie Binder MD;  Location: [facility];  Service: Endoscopy;  Laterality: N/A;  7:30am  . None to date     As of 05/18/15    No current facility-administered medications for this visit.    No current outpatient medications on file.   Facility-Administered Medications Ordered in Other Visits  Medication Dose Route Frequency Provider Last Rate Last Dose  . 0.9 % NaCl with KCl 20 mEq/ L  infusion   Intravenous Continuous OReubin Milan MD 125 mL/hr at 03/27/18 [phone]   . acetaminophen (TYLENOL) tablet 650 mg  650 mg Oral Q6H PRN OReubin Milan MD       Or  . acetaminophen (TYLENOL) suppository 650 mg  650 mg Rectal Q6H PRN OReubin Milan MD      . heparin injection 5,000 Units  5,000 Units Subcutaneous Q8H OReubin Milan MD      . morphine 4 MG/ML injection 4 mg  4 mg Intravenous Once OReubin Milan MD      . morphine 4 MG/ML injection 4 mg  4 mg Intravenous Q3H PRN OReubin Milan MD      . ondansetron ([facility] injection 4 mg  4 mg Intravenous Once OReubin Milan MD      . ondansetron ([facility] tablet 4 mg  4 mg Oral Q6H PRN OReubin Milan MD       Or  . ondansetron ([facility] injection 4 mg  4 mg Intravenous Q6H PRN OReubin Milan MD      . piperacillin-tazobactam (ZOSYN) IVPB 3.375 g  3.375 g Intravenous Q8H OReubin Milan MD  Allergies as of 03/27/2018 - Review Complete 03/27/2018  Allergen Reaction Noted  . Remicade [infliximab] Shortness Of Breath 05/11/2017    Family History  Problem Relation Age of Onset  . Hypertension Mother   . Liver cancer Father        deceased age 68  . CVA Father   . Colon cancer Neg Hx     Social History   Socioeconomic History  . Marital status: Single    Spouse name: Not on file  . Number of children: Not on file  . Years of education: Not on file  . Highest education level: Not on file  Occupational History  . Not on file  Social Needs  . Financial resource strain: Not on file  . Food insecurity:    Worry: Not on file    Inability: Not on file  . Transportation needs:     Medical: Not on file    Non-medical: Not on file  Tobacco Use  . Smoking status: Never Smoker  . Smokeless tobacco: Never Used  Substance and Sexual Activity  . Alcohol use: No    Alcohol/week: 0.0 standard drinks  . Drug use: No  . Sexual activity: Yes    Birth control/protection: None  Lifestyle  . Physical activity:    Days per week: Not on file    Minutes per session: Not on file  . Stress: Not on file  Relationships  . Social connections:    Talks on phone: Not on file    Gets together: Not on file    Attends religious service: Not on file    Active member of club or organization: Not on file    Attends meetings of clubs or organizations: Not on file    Relationship status: Not on file  Other Topics Concern  . Not on file  Social History Narrative  . Not on file    Review of Systems: General: Negative for anorexia, weight loss, fever, chills, fatigue, weakness. Eyes: Negative for vision changes.  ENT: Negative for hoarseness, difficulty swallowing , nasal congestion. CV: Negative for chest pain, angina, palpitations, dyspnea on exertion, peripheral edema.  Respiratory: Negative for dyspnea at rest, dyspnea on exertion, cough, sputum, wheezing.  GI: See history of present illness. GU:  Negative for dysuria, hematuria, urinary incontinence, urinary frequency, nocturnal urination.  MS: Negative for joint pain, low back pain.  Derm: Negative for rash or itching.  Neuro: Negative for weakness, abnormal sensation, seizure, frequent headaches, memory loss, confusion.  Psych: Negative for anxiety, depression, suicidal ideation, hallucinations.  Endo: Negative for unusual weight change.  Heme: Negative for bruising or bleeding. Allergy: Negative for rash or hives.   Physical Exam: There were no vitals taken for this visit. General:   Alert and oriented. Pleasant and cooperative. Well-nourished and well-developed.  Head:  Normocephalic and atraumatic. Eyes:  Without  icterus, sclera clear and conjunctiva pink.  Ears:  Normal auditory acuity. Mouth:  No deformity or lesions, oral mucosa pink.  Throat/Neck:  Supple, without mass or thyromegaly. Cardiovascular:  S1, S2 present without murmurs appreciated. Normal pulses noted. Extremities without clubbing or edema. Respiratory:  Clear to auscultation bilaterally. No wheezes, rales, or rhonchi. No distress.  Gastrointestinal:  +BS, soft, non-tender and non-distended. No HSM noted. No guarding or rebound. No masses appreciated.  Rectal:  Deferred  Musculoskalatal:  Symmetrical without gross deformities. Normal posture. Skin:  Intact without significant lesions or rashes. Neurologic:  Alert and oriented x4;  grossly normal neurologically. Psych:  Alert and cooperative. Normal mood and affect. Heme/Lymph/Immune: No significant cervical adenopathy. No excessive bruising noted.    03/27/2018 8:04 AM   Disclaimer: This note was dictated with voice recognition software. Similar sounding words can inadvertently be transcribed and may not be corrected upon review.

## 2018-03-27 NOTE — H&P (Signed)
History and Physical    Gerald Valentine KGM:[id number] DOB: Jan 02, 1986 DOA: 03/26/2018  PCP: Patient, No Pcp Per   Patient coming from: Home.  I have personally briefly reviewed patient's old medical records in [place]  Chief Complaint: Fever and urinary discomfort.  HPI: Gerald Valentine is a 32 y.o. male with medical history significant of Crohn's disease of small and large intestine who is coming to the emergency department with complaints of fever for the past 2 weeks associated with burning dysuria and urinary bladder cramps for the past 4 days.  He also complains of fatigue, malaise, body aches and night sweats for the past 2 weeks.  He denies gross hematuria, flank pain, nausea, emesis, diarrhea, constipation, melena or hematochezia.  Last time he had his Humira injection was also about 2 weeks ago.  He denies headache, sore throat, rhinorrhea, productive cough, wheezing, hemoptysis, chest pain, palpitations, dizziness, dyspnea, PND, orthopnea or pitting edema lower extremities.  He was seen in the local urgent care, had an urinalysis and was told it was negative.  He was seen in the emergency department in [place], [place], but was told that his work-up was unremarkable.  Yesterday evening, the patient states that the symptoms continued and he had fever again so he decided to come to the emergency department.  ED Course: Initial vital signs temperature 101.2 F, pulse 120, respirations 14, blood pressure 152/98 mmHg and O2 sat 96% on room air.   His urinalysis had a hazy appearance, with moderate hemoglobinuria, ketones of 5 and proteinuria more than 300 mg/dL.  RBC more than 50 and WBC 21-50 per hpf.  There was rare bacteria.  CBC shows a white count of 8.3, hemoglobin 10.9 g/dL and platelets 352.  Lactic acid was normal.  Sodium 133 and potassium 3.3 mmol/L.  All other electrolytes are within normal limits 1 calcium is corrected to albumin.  BUN was 15, creatinine 1.25 and glucose 97  mg/dL.  Albumin 3.1 g/dL and AST 14 units/L.  Bilirubin, lipase, ALT and alk phos are within normal limits.  Review of Systems: As per HPI otherwise 10 point review of systems negative.   Past Medical History:  Diagnosis Date  . Crohn's disease of both small and large intestine [facility])     Past Surgical History:  Procedure Laterality Date  . BIOPSY  05/09/2017   Procedure: BIOPSY;  Surgeon: [name], MD;  Location: [facility];  Service: Endoscopy;;  colon  . COLONOSCOPY    . COLONOSCOPY N/A 07/10/2015   Dr. Oneida Alar: stricture in distal ileum due to Crohn's. pseudopolyp in descending colon. rectum pathology with active colitis, left and ascending colon with quiescent colitis, rectum with active colitis. 6 month Surveillance  needed after starting Humira  (which was May 2017)   . COLONOSCOPY WITH PROPOFOL N/A 05/09/2017   Procedure: COLONOSCOPY WITH PROPOFOL;  Surgeon: [name], MD;  Location: [facility];  Service: Endoscopy;  Laterality: N/A;  7:30am  . None to date     As of 05/18/15     reports that he has never smoked. He has never used smokeless tobacco. He reports that he does not drink alcohol or use drugs.  Allergies  Allergen Reactions  . Remicade [Infliximab] Shortness Of Breath    Family History  Problem Relation Age of Onset  . Hypertension Mother   . Liver cancer Father        deceased age 70  . CVA Father   . Colon  cancer Neg Hx    Prior to Admission medications   Medication Sig Start Date End Date Taking? Authorizing Provider  Adalimumab (HUMIRA) 40 MG/0.8ML PSKT Inject 0.8 mLs (40 mg total) into the skin every 14 (fourteen) days. 11/21/17  Yes Annitta Needs, NP  oseltamivir (TAMIFLU) 75 MG capsule Take 75 mg by mouth 2 (two) times daily. 5 day course starting on 03/25/2018 03/25/18  Yes [provider]    Physical Exam: Vitals:   03/26/18 2311 03/26/18 2330 03/27/18 0100 03/27/18 0355  BP:  (!) 137/96 (!) 135/94 (!) 137/94  Pulse:  (!)  106 (!) 105 78  Resp:      Temp: 98.9 F (37.2 C)   98.9 F (37.2 C)  TempSrc: Oral   Oral  SpO2:  97% 99% 100%  Weight:      Height:        Constitutional: Mildly febrile, but in NAD, calm, comfortable Eyes: PERRL, lids and conjunctivae normal ENMT: Mucous membranes are mildly dry. Posterior pharynx clear of any exudate or lesions. Neck: normal, supple, no masses, no thyromegaly Respiratory: clear to auscultation bilaterally, no wheezing, no crackles. Normal respiratory effort. No accessory muscle use.  Cardiovascular: Regular rate and rhythm, no murmurs / rubs / gallops. No extremity edema. 2+ pedal pulses. No carotid bruits.  Abdomen: Nondistended.  Bowel sounds positive.  Soft, positive suprapubic tenderness, no guarding or rebound, no masses palpated.  No flank pain.  No hepatosplenomegaly.  The Musculoskeletal: no clubbing / cyanosis. Good ROM, no contractures. Normal muscle tone.  Skin: no rashes, lesions, ulcers on limited dermatological examination. Neurologic: CN 2-12 grossly intact. Sensation intact, DTR normal. Strength 5/5 in all 4.  Psychiatric: Normal judgment and insight. Alert and oriented x 4. Normal mood.   Labs on Admission: I have personally reviewed following labs and imaging studies  CBC: Recent Labs  Lab 03/26/18 2259  WBC 8.3  NEUTROABS 6.1  HGB 10.9*  HCT 34.9*  MCV 88.8  PLT 035   Basic Metabolic Panel: Recent Labs  Lab 03/26/18 2259  NA 133*  K 3.3*  CL 101  CO2 24  GLUCOSE 97  BUN 15  CREATININE 1.25*  CALCIUM 8.3*  MG 2.0  PHOS 2.8   GFR: Estimated Creatinine Clearance: 94.8 mL/min (A) (by C-G formula based on SCr of 1.25 mg/dL (H)). Liver Function Tests: Recent Labs  Lab 03/26/18 2259  AST 14*  ALT 12  ALKPHOS 73  BILITOT 1.0  PROT 7.7  ALBUMIN 3.1*   Recent Labs  Lab 03/26/18 2259  LIPASE 25   No results for input(s): AMMONIA in the last 168 hours. Coagulation Profile: No results for input(s): INR, PROTIME in the  last 168 hours. Cardiac Enzymes: No results for input(s): CKTOTAL, CKMB, CKMBINDEX, TROPONINI in the last 168 hours. BNP (last 3 results) No results for input(s): PROBNP in the last 8760 hours. HbA1C: No results for input(s): HGBA1C in the last 72 hours. CBG: No results for input(s): GLUCAP in the last 168 hours. Lipid Profile: No results for input(s): CHOL, HDL, LDLCALC, TRIG, CHOLHDL, LDLDIRECT in the last 72 hours. Thyroid Function Tests: No results for input(s): TSH, T4TOTAL, FREET4, T3FREE, THYROIDAB in the last 72 hours. Anemia Panel: No results for input(s): VITAMINB12, FOLATE, FERRITIN, TIBC, IRON, RETICCTPCT in the last 72 hours. Urine analysis:    Component Value Date/Time   COLORURINE AMBER (A) 03/26/2018 2213   APPEARANCEUR HAZY (A) 03/26/2018 2213   LABSPEC 1.027 03/26/2018 2213   PHURINE 6.0  03/26/2018 2213   GLUCOSEU NEGATIVE 03/26/2018 2213   HGBUR MODERATE (A) 03/26/2018 2213   BILIRUBINUR NEGATIVE 03/26/2018 2213   KETONESUR 5 (A) 03/26/2018 2213   PROTEINUR >=300 (A) 03/26/2018 2213   NITRITE NEGATIVE 03/26/2018 2213   LEUKOCYTESUR NEGATIVE 03/26/2018 2213    Radiological Exams on Admission: Ct Abdomen Pelvis W Contrast  Result Date: 03/27/2018 CLINICAL DATA:  Dysuria.  History of Crohn's disease.  Fever. EXAM: CT ABDOMEN AND PELVIS WITH CONTRAST TECHNIQUE: Multidetector CT imaging of the abdomen and pelvis was performed using the standard protocol following bolus administration of intravenous contrast. CONTRAST:  169m ISOVUE-300 IOPAMIDOL (ISOVUE-300) INJECTION 61% COMPARISON:  06/03/2015 FINDINGS: Lower chest: Lung bases are clear. No effusions. Heart is normal size. Hepatobiliary: No focal hepatic abnormality. Gallbladder unremarkable. Pancreas: No focal abnormality or ductal dilatation. Spleen: No focal abnormality.  Normal size. Adrenals/Urinary Tract: Marked bladder wall thickening along the superior bladder wall this is likely secondarily inflamed from  inflamed adjacent bowel. Fistula described below courses near the superior bladder wall. It is difficult to completely exclude this extends and involves the bladder wall superiorly. No hydronephrosis. Adrenal glands unremarkable. Stomach/Bowel: Markedly inflamed and thickened terminal ileum. There appears to be a fistulous tract extending from the terminal ileum posteriorly to the sigmoid colon. This fistulous tract is also near the superior wall of the bladder which is markedly thickened. No evidence of bowel obstruction. Vascular/Lymphatic: No evidence of aneurysm or adenopathy. Reproductive: No visible focal abnormality. Other: Small amount of free fluid in the pelvis.  No free air. Musculoskeletal: No acute bony abnormality. IMPRESSION: Markedly abnormal terminal ileum with wall thickening and inflammation. There appears to be a fistulous tract which extends posteriorly from the terminal ileum to the sigmoid colon. This passes near the superior wall of the urinary bladder which is markedly thickened secondarily. It is difficult to exclude involvement of the superior bladder wall with this fistulous tract. No visible intraluminal gas within the bladder. No evidence of bowel obstruction. Small amount of free fluid in the pelvis. No focal fluid collection/abscess. Electronically Signed   By: KRolm BaptiseM.D.   On: 03/27/2018 01:49    EKG: Independently reviewed.   Assessment/Plan Principal Problem:   Terminal ileitis of small intestine, with fistula (HCC)   Crohn's disease of both small and large intestine with complication (HCC) Observation/telemetry. Continue IV fluids. Keep n.p.o. Analgesics as needed. Antiemetics as needed. Continue Zosyn 3.375 g IVPB every 8 hours. Defer glucocorticoids until seen by GI. Consult GI and general surgery.  Active Problems:   Anemia Check anemia panel. Monitor hematocrit and hemoglobin.    Hypokalemia Replacing. Follow-up potassium level.    Volume  depletion Continue IV fluids.    DVT prophylaxis: Heparin SQ. Code Status: Full code. Family Communication: Disposition Plan: Duration for IV antibiotic therapy and further work-up. Consults called: Routine gastroenterology and general surgery consult. Admission status: Observation/telemetry.   DReubin MilanMD [facility] Pager [phone] If 7PM-7AM, please contact night-coverage [email or web address] Password [email or web address]  03/27/2018, 5:01 AM

## 2018-03-28 ENCOUNTER — Telehealth: Payer: Self-pay | Admitting: Gastroenterology

## 2018-03-28 DIAGNOSIS — E876 Hypokalemia: Secondary | ICD-10-CM

## 2018-03-28 DIAGNOSIS — K50819 Crohn's disease of both small and large intestine with unspecified complications: Principal | ICD-10-CM

## 2018-03-28 DIAGNOSIS — K50013 Crohn's disease of small intestine with fistula: Secondary | ICD-10-CM

## 2018-03-28 DIAGNOSIS — D649 Anemia, unspecified: Secondary | ICD-10-CM

## 2018-03-28 LAB — CBC WITH DIFFERENTIAL/PLATELET
Abs Immature Granulocytes: 0.02 10*3/uL (ref 0.00–0.07)
Basophils Absolute: 0 10*3/uL (ref 0.0–0.1)
Basophils Relative: 0 %
Eosinophils Absolute: 0 10*3/uL (ref 0.0–0.5)
Eosinophils Relative: 0 %
HCT: 34.7 % — ABNORMAL LOW (ref 39.0–52.0)
Hemoglobin: 10.5 g/dL — ABNORMAL LOW (ref 13.0–17.0)
Immature Granulocytes: 0 %
Lymphocytes Relative: 11 %
Lymphs Abs: 0.6 10*3/uL — ABNORMAL LOW (ref 0.7–4.0)
MCH: 27.7 pg (ref 26.0–34.0)
MCHC: 30.3 g/dL (ref 30.0–36.0)
MCV: 91.6 fL (ref 80.0–100.0)
MONO ABS: 0.3 10*3/uL (ref 0.1–1.0)
Monocytes Relative: 5 %
Neutro Abs: 4.9 10*3/uL (ref 1.7–7.7)
Neutrophils Relative %: 84 %
Platelets: 367 10*3/uL (ref 150–400)
RBC: 3.79 MIL/uL — AB (ref 4.22–5.81)
RDW: 13 % (ref 11.5–15.5)
WBC: 5.8 10*3/uL (ref 4.0–10.5)
nRBC: 0 % (ref 0.0–0.2)

## 2018-03-28 LAB — URINE CULTURE: CULTURE: NO GROWTH

## 2018-03-28 LAB — BASIC METABOLIC PANEL
Anion gap: 5 (ref 5–15)
BUN: 9 mg/dL (ref 6–20)
CO2: 24 mmol/L (ref 22–32)
Calcium: 8.4 mg/dL — ABNORMAL LOW (ref 8.9–10.3)
Chloride: 108 mmol/L (ref 98–111)
Creatinine, Ser: 1.02 mg/dL (ref 0.61–1.24)
GFR calc Af Amer: >60 mL/min (ref >60)
GFR calc non Af Amer: >60 mL/min (ref >60)
GLUCOSE: 119 mg/dL — AB (ref 70–99)
Potassium: 4.6 mmol/L (ref 3.5–5.1)
Sodium: 137 mmol/L (ref 135–145)

## 2018-03-28 LAB — HIV ANTIBODY (ROUTINE TESTING W REFLEX): HIV Screen 4th Generation wRfx: NONREACTIVE

## 2018-03-28 MED ORDER — METRONIDAZOLE 500 MG PO TABS: 500.0000 mg | ORAL_TABLET | Freq: Three times a day (TID) | ORAL | 0 refills | Status: DC

## 2018-03-28 MED ORDER — BUDESONIDE 3 MG PO CPEP: 9.0000 mg | ORAL_CAPSULE | Freq: Every day | ORAL | 0 refills | Status: DC

## 2018-03-28 MED ORDER — CIPROFLOXACIN HCL 500 MG PO TABS: 500.0000 mg | ORAL_TABLET | Freq: Two times a day (BID) | ORAL | 0 refills | Status: DC

## 2018-03-28 MED ORDER — METRONIDAZOLE 500 MG PO TABS
500.0000 mg | ORAL_TABLET | Freq: Three times a day (TID) | ORAL | Status: DC
  Administered 2018-03-28: 500 mg via ORAL
  Filled 2018-03-28: qty 1

## 2018-03-28 MED ORDER — CIPROFLOXACIN HCL 250 MG PO TABS
500.0000 mg | ORAL_TABLET | Freq: Two times a day (BID) | ORAL | Status: DC
  Administered 2018-03-28: 500 mg via ORAL
  Filled 2018-03-28: qty 2

## 2018-03-28 NOTE — Telephone Encounter (Signed)
Patient scheduled and spoke to nurse, she will let patient know date and time

## 2018-03-28 NOTE — Discharge Summary (Signed)
Physician Discharge Summary  Gerald Valentine HDQ:[id number] DOB: [birth year] DOA: 03/26/2018  PCP: Gerald Valentine, No Pcp Per  Admit date: 03/26/2018 Discharge date: 03/28/2018  Admitted From: Home Disposition:  Home  Recommendations for Outpatient Follow-up:  1. Follow up with PCP in 1-2 weeks 2. Please obtain BMP/CBC in one week     Discharge Condition: Stable CODE STATUS:FULL Diet recommendation: Regular   Brief/Interim Summary: 32 y.o. male with medical history significant of Crohn's disease of small and large intestine who is coming to the emergency department with complaints of fever for the past 2 weeks associated with burning dysuria and urinary bladder cramps for the past 4 days.  He also complains of fatigue, malaise, body aches and night sweats for the past 2 weeks.  He denies gross hematuria, flank pain, nausea, emesis, diarrhea, constipation, melena or hematochezia.  Last time he had his Humira injection was also about 2 weeks ago.  He was seen in the emergency department in [place], [place], but was told that his work-up was unremarkable.  Yesterday evening, the Gerald Valentine states that the symptoms continued and he had fever again so he decided to come to the emergency department.   Discharge Diagnoses:  Crohn's Disease Exacerbation/Terminal Ileitis -CT abd/pelvis--marked wall thickening of TI with fistulous tract from TI to sigmoid colon which passes near urinary bladder which is thickened. There is concern of possible enterovesical fistula but Urine bladder without extraluminal gas. -zosyn started -IV solumedrol started -GI consulted-->started Entocort -pt improved symptomatically -diet advanced which pt tolerated -General surgery consulted-->no urgent surgery needed, recommended follow up with [facility] for possible surgery in future -d/c home with cipro/flagyl x 7 more days -CRP 21.0  Hypokalemia -repleted  Dehydration -improved with IVF    Discharge  Instructions   Allergies as of 03/28/2018      Reactions   Remicade [infliximab] Shortness Of Breath      Medication List    STOP taking these medications   oseltamivir 75 MG capsule Commonly known as:  TAMIFLU     TAKE these medications   Adalimumab 40 MG/0.8ML Pskt Inject 0.8 mLs (40 mg total) into the skin every 14 (fourteen) days.   budesonide 3 MG 24 hr capsule Commonly known as:  ENTOCORT EC Take 3 capsules (9 mg total) by mouth daily. Start taking on:  03/29/2018   ciprofloxacin 500 MG tablet Commonly known as:  CIPRO Take 1 tablet (500 mg total) by mouth 2 (two) times daily.   metroNIDAZOLE 500 MG tablet Commonly known as:  FLAGYL Take 1 tablet (500 mg total) by mouth every 8 (eight) hours.      Follow-up Information    [name], PA-C On 05/02/2018.   Specialty:  Gastroenterology Why:  at 11:30 am Contact information: [address] [place] [address] [phone]          Allergies  Allergen Reactions  . Remicade [Infliximab] Shortness Of Breath    Consultations:  GI--Fields   Procedures/Studies: Ct Abdomen Pelvis W Contrast  Result Date: 03/27/2018 CLINICAL DATA:  Dysuria.  History of Crohn's disease.  Fever. EXAM: CT ABDOMEN AND PELVIS WITH CONTRAST TECHNIQUE: Multidetector CT imaging of the abdomen and pelvis was performed using the standard protocol following bolus administration of intravenous contrast. CONTRAST:  136m ISOVUE-300 IOPAMIDOL (ISOVUE-300) INJECTION 61% COMPARISON:  06/03/2015 FINDINGS: Lower chest: Lung bases are clear. No effusions. Heart is normal size. Hepatobiliary: No focal hepatic abnormality. Gallbladder unremarkable. Pancreas: No focal abnormality or ductal dilatation. Spleen: No focal abnormality.  Normal  size. Adrenals/Urinary Tract: Marked bladder wall thickening along the superior bladder wall this is likely secondarily inflamed from inflamed adjacent bowel. Fistula described below courses near the superior  bladder wall. It is difficult to completely exclude this extends and involves the bladder wall superiorly. No hydronephrosis. Adrenal glands unremarkable. Stomach/Bowel: Markedly inflamed and thickened terminal ileum. There appears to be a fistulous tract extending from the terminal ileum posteriorly to the sigmoid colon. This fistulous tract is also near the superior wall of the bladder which is markedly thickened. No evidence of bowel obstruction. Vascular/Lymphatic: No evidence of aneurysm or adenopathy. Reproductive: No visible focal abnormality. Other: Small amount of free fluid in the pelvis.  No free air. Musculoskeletal: No acute bony abnormality. IMPRESSION: Markedly abnormal terminal ileum with wall thickening and inflammation. There appears to be a fistulous tract which extends posteriorly from the terminal ileum to the sigmoid colon. This passes near the superior wall of the urinary bladder which is markedly thickened secondarily. It is difficult to exclude involvement of the superior bladder wall with this fistulous tract. No visible intraluminal gas within the bladder. No evidence of bowel obstruction. Small amount of free fluid in the pelvis. No focal fluid collection/abscess. Electronically Signed   By: Rolm Baptise M.D.   On: 03/27/2018 01:49         Discharge Exam: Vitals:   03/28/18 0529 03/28/18 1456  BP: 97/61 (!) 105/54  Pulse: (!) 59 65  Resp: 18 18  Temp: (!) 97.5 F (36.4 C) 98.1 F (36.7 C)  SpO2: 100% 100%   Vitals:   03/27/18 2007 03/27/18 2117 03/28/18 0529 03/28/18 1456  BP:  111/76 97/61 (!) 105/54  Pulse:  74 (!) 59 65  Resp:  20 18 18   Temp:  98.6 F (37 C) (!) 97.5 F (36.4 C) 98.1 F (36.7 C)  TempSrc:  Oral Oral Oral  SpO2: 99% 99% 100% 100%  Weight:      Height:        General: Pt is alert, awake, not in acute distress Cardiovascular: RRR, S1/S2 +, no rubs, no gallops Respiratory: CTA bilaterally, no wheezing, no rhonchi Abdominal: Soft, NT,  ND, bowel sounds + Extremities: no edema, no cyanosis   The results of significant diagnostics from this hospitalization (including imaging, microbiology, ancillary and laboratory) are listed below for reference.    Significant Diagnostic Studies: Ct Abdomen Pelvis W Contrast  Result Date: 03/27/2018 CLINICAL DATA:  Dysuria.  History of Crohn's disease.  Fever. EXAM: CT ABDOMEN AND PELVIS WITH CONTRAST TECHNIQUE: Multidetector CT imaging of the abdomen and pelvis was performed using the standard protocol following bolus administration of intravenous contrast. CONTRAST:  163m ISOVUE-300 IOPAMIDOL (ISOVUE-300) INJECTION 61% COMPARISON:  06/03/2015 FINDINGS: Lower chest: Lung bases are clear. No effusions. Heart is normal size. Hepatobiliary: No focal hepatic abnormality. Gallbladder unremarkable. Pancreas: No focal abnormality or ductal dilatation. Spleen: No focal abnormality.  Normal size. Adrenals/Urinary Tract: Marked bladder wall thickening along the superior bladder wall this is likely secondarily inflamed from inflamed adjacent bowel. Fistula described below courses near the superior bladder wall. It is difficult to completely exclude this extends and involves the bladder wall superiorly. No hydronephrosis. Adrenal glands unremarkable. Stomach/Bowel: Markedly inflamed and thickened terminal ileum. There appears to be a fistulous tract extending from the terminal ileum posteriorly to the sigmoid colon. This fistulous tract is also near the superior wall of the bladder which is markedly thickened. No evidence of bowel obstruction. Vascular/Lymphatic: No evidence of aneurysm or adenopathy.  Reproductive: No visible focal abnormality. Other: Small amount of free fluid in the pelvis.  No free air. Musculoskeletal: No acute bony abnormality. IMPRESSION: Markedly abnormal terminal ileum with wall thickening and inflammation. There appears to be a fistulous tract which extends posteriorly from the terminal  ileum to the sigmoid colon. This passes near the superior wall of the urinary bladder which is markedly thickened secondarily. It is difficult to exclude involvement of the superior bladder wall with this fistulous tract. No visible intraluminal gas within the bladder. No evidence of bowel obstruction. Small amount of free fluid in the pelvis. No focal fluid collection/abscess. Electronically Signed   By: Rolm Baptise M.D.   On: 03/27/2018 01:49     Microbiology: Recent Results (from the past 240 hour(s))  Urine culture     Status: None   Collection Time: 03/26/18 10:40 PM  Result Value Ref Range Status   Specimen Description   Final    URINE, RANDOM Performed at [facility], [address] [place], [place] [address]    Special Requests   Final    NONE Performed at [facility], [address], [place], [place] [address]    Culture   Final    NO GROWTH Performed at [facility], [place] [address] [place], [place] [address]    Report Status 03/28/2018 FINAL  Final     Labs: Basic Metabolic Panel: Recent Labs  Lab 03/26/18 2259 03/28/18 0548  NA 133* 137  K 3.3* 4.6  CL 101 108  CO2 24 24  GLUCOSE 97 119*  BUN 15 9  CREATININE 1.25* 1.02  CALCIUM 8.3* 8.4*  MG 2.0  --   PHOS 2.8  --    Liver Function Tests: Recent Labs  Lab 03/26/18 2259  AST 14*  ALT 12  ALKPHOS 73  BILITOT 1.0  PROT 7.7  ALBUMIN 3.1*   Recent Labs  Lab 03/26/18 2259  LIPASE 25   No results for input(s): AMMONIA in the last 168 hours. CBC: Recent Labs  Lab 03/26/18 2259 03/27/18 0451 03/28/18 0548  WBC 8.3 7.8 5.8  NEUTROABS 6.1 5.2 4.9  HGB 10.9* 10.8* 10.5*  HCT 34.9* 34.6* 34.7*  MCV 88.8 91.5 91.6  PLT 352 322 367   Cardiac Enzymes: No results for input(s): CKTOTAL, CKMB, CKMBINDEX, TROPONINI in the last 168 hours. BNP: Invalid input(s): POCBNP CBG: No results for input(s): GLUCAP in the last 168 hours.  Time coordinating discharge:  36 minutes  Signed:  Orson Eva, DO [facility] Pager: [phone] 03/28/2018, 5:44 PM

## 2018-03-28 NOTE — Progress Notes (Signed)
IV removed, 2x2 gauze and paper tape applied to site, patient tolerated well.  Reviewed AVS with patient who verbalized understanding.  Work note given to patient. Patient transported home by his girlfriend.

## 2018-03-28 NOTE — Telephone Encounter (Signed)
Patient needs hospital f/u in 4 weeks, may use urgent.

## 2018-03-28 NOTE — Progress Notes (Signed)
Subjective:  Hungry. Abdominal discomfort much improved.   Objective: Vital signs in last 24 hours: Temp:  [97.5 F (36.4 C)-98.6 F (37 C)] 97.5 F (36.4 C) (12/04 0529) Pulse Rate:  [59-74] 59 (12/04 0529) Resp:  [17-20] 18 (12/04 0529) BP: (93-111)/(58-76) 97/61 (12/04 0529) SpO2:  [99 %-100 %] 100 % (12/04 0529) Last BM Date: 03/25/18 General:   Alert,  Well-developed, well-nourished, pleasant and cooperative in NAD Head:  Normocephalic and atraumatic. Eyes:  Sclera clear, no icterus.  Abdomen:  Soft,  nondistended. Minimal suprapubic tenderness. Normal bowel sounds, without guarding, and without rebound.   Neurologic:  Alert and  oriented x4;  grossly normal neurologically. Skin:  Intact without significant lesions or rashes. Psych:  Alert and cooperative. Normal mood and affect.  Intake/Output from previous day: 12/03 0701 - 12/04 0700 In: 3388.8 [P.O.:720; I.V.:2553.2; IV Piggyback:115.6] Out: -  Intake/Output this shift: No intake/output data recorded.  Lab Results: CBC Recent Labs    03/26/18 2259 03/27/18 0451 03/28/18 0548  WBC 8.3 7.8 5.8  HGB 10.9* 10.8* 10.5*  HCT 34.9* 34.6* 34.7*  MCV 88.8 91.5 91.6  PLT 352 322 367   BMET Recent Labs    03/26/18 2259 03/28/18 0548  NA 133* 137  K 3.3* 4.6  CL 101 108  CO2 24 24  GLUCOSE 97 119*  BUN 15 9  CREATININE 1.25* 1.02  CALCIUM 8.3* 8.4*   LFTs Recent Labs    03/26/18 2259  BILITOT 1.0  ALKPHOS 73  AST 14*  ALT 12  PROT 7.7  ALBUMIN 3.1*   Recent Labs    03/26/18 2259  LIPASE 25   PT/INR No results for input(s): LABPROT, INR in the last 72 hours.    Imaging Studies: Ct Abdomen Pelvis W Contrast  Result Date: 03/27/2018 CLINICAL DATA:  Dysuria.  History of Crohn's disease.  Fever. EXAM: CT ABDOMEN AND PELVIS WITH CONTRAST TECHNIQUE: Multidetector CT imaging of the abdomen and pelvis was performed using the standard protocol following bolus administration of intravenous contrast.  CONTRAST:  134m ISOVUE-300 IOPAMIDOL (ISOVUE-300) INJECTION 61% COMPARISON:  06/03/2015 FINDINGS: Lower chest: Lung bases are clear. No effusions. Heart is normal size. Hepatobiliary: No focal hepatic abnormality. Gallbladder unremarkable. Pancreas: No focal abnormality or ductal dilatation. Spleen: No focal abnormality.  Normal size. Adrenals/Urinary Tract: Marked bladder wall thickening along the superior bladder wall this is likely secondarily inflamed from inflamed adjacent bowel. Fistula described below courses near the superior bladder wall. It is difficult to completely exclude this extends and involves the bladder wall superiorly. No hydronephrosis. Adrenal glands unremarkable. Stomach/Bowel: Markedly inflamed and thickened terminal ileum. There appears to be a fistulous tract extending from the terminal ileum posteriorly to the sigmoid colon. This fistulous tract is also near the superior wall of the bladder which is markedly thickened. No evidence of bowel obstruction. Vascular/Lymphatic: No evidence of aneurysm or adenopathy. Reproductive: No visible focal abnormality. Other: Small amount of free fluid in the pelvis.  No free air. Musculoskeletal: No acute bony abnormality. IMPRESSION: Markedly abnormal terminal ileum with wall thickening and inflammation. There appears to be a fistulous tract which extends posteriorly from the terminal ileum to the sigmoid colon. This passes near the superior wall of the urinary bladder which is markedly thickened secondarily. It is difficult to exclude involvement of the superior bladder wall with this fistulous tract. No visible intraluminal gas within the bladder. No evidence of bowel obstruction. Small amount of free fluid in the pelvis. No focal fluid collection/abscess.  Electronically Signed   By: Rolm Baptise M.D.   On: 03/27/2018 01:49  [2 weeks]   Assessment:  32 year old male with long-standing history of Crohn's ileocolitis diagnosed at age 89, last  colonoscopy January 2019 revealing ileal stricture and no evidence of active colonic disease on Humira.  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  Clinically improved since admission with empiric IV antibiotics.  Urine culture pending. Clinically much improved.   Normocytic anemia: Multifactorial in the setting of acute illness, chronic disease hemoglobin has declined from baseline.  Ferritin normal may be falsely elevated in the setting of acute illness.  Iron low.  Appreciate surgical input.  Consider outpatient elective surgery consultation at tertiary care center for management of stricture and fistula based on pending Humira labs and clinical course.  Plan: 1. Follow-up Humira trough level and antibody levels. 2. Continue antibiotic regimen per attending.  3. Advance to low residue diet.  4. Continue Entocort 14m daily. Humira q 14 days. Due today, can take day of discharge.  5. Follow up office visit with [facility] in 4 weeks.  6. OK for discharge today from GI standpoint.   LLaureen Ochs LBernarda CaffeyRSt. Mary - [facility] [id number] AM     LOS: 1 day

## 2018-04-02 LAB — MISC LABCORP TEST (SEND OUT): Labcorp test code: 503890

## 2018-04-03 NOTE — Progress Notes (Signed)
Humira levels reviewed. He has no antibodies detected. Although drug level is detectable at 4.2, it is not at ideal range proposed (greater or equal to 7.5). He should be on entocort daily. Could increase Humira to weekly and then recheck [facility] labs in 2 months. He has an appt in Jan 2020. FYI to [place] who will be seeing him.

## 2018-04-04 NOTE — Progress Notes (Signed)
LMOM to call.

## 2018-04-04 NOTE — Progress Notes (Signed)
PT is aware of results and his appt in Jan. He is taking the Entocort and will need some more since he was not given refills. He uses [facility] on [address] in [place].

## 2018-04-06 ENCOUNTER — Telehealth: Payer: Self-pay | Admitting: Gastroenterology

## 2018-04-06 MED ORDER — BUDESONIDE 3 MG PO CPEP: 9.0000 mg | ORAL_CAPSULE | Freq: Every day | ORAL | 1 refills | Status: DC

## 2018-04-06 NOTE — Progress Notes (Signed)
PT is aware.

## 2018-04-06 NOTE — Telephone Encounter (Signed)
Refill of Entocort sent to pharmacy.

## 2018-04-06 NOTE — Progress Notes (Signed)
Gerald Valentine, please send in prescription.

## 2018-04-09 ENCOUNTER — Other Ambulatory Visit: Payer: Self-pay | Admitting: Gastroenterology

## 2018-04-09 MED ORDER — ADALIMUMAB 40 MG/0.8ML ~~LOC~~ PSKT: 1.0000 | PREFILLED_SYRINGE | SUBCUTANEOUS | 3 refills | Status: DC

## 2018-04-09 NOTE — Progress Notes (Unsigned)
Doris: I spoke with Dr. Oneida Alar. Recommending Humira weekly. I have sent this to [facility]. Does it need to go to a different pharmacy? We need to recheck levels and antibodies in 3 months.

## 2018-04-10 NOTE — Progress Notes (Signed)
LMOM to call.

## 2018-04-12 NOTE — Progress Notes (Signed)
Tried to call, VM full and cannot leave a message. Mailing a letter to check with his pharmacy and call with questions.

## 2018-04-16 ENCOUNTER — Other Ambulatory Visit: Payer: Self-pay

## 2018-04-16 ENCOUNTER — Telehealth: Payer: Self-pay

## 2018-04-16 ENCOUNTER — Other Ambulatory Visit (HOSPITAL_COMMUNITY)
Admission: RE | Admit: 2018-04-16 | Discharge: 2018-04-16 | Disposition: A | Discharge status: Home / Self Care | Payer: BLUE CROSS/BLUE SHIELD | Source: Ambulatory Visit | Attending: Gastroenterology | Admitting: Gastroenterology

## 2018-04-16 DIAGNOSIS — R350 Frequency of micturition: Secondary | ICD-10-CM

## 2018-04-16 DIAGNOSIS — R300 Dysuria: Secondary | ICD-10-CM

## 2018-04-16 LAB — URINALYSIS, ROUTINE W REFLEX MICROSCOPIC
Bilirubin Urine: NEGATIVE
Glucose, UA: NEGATIVE mg/dL
Ketones, ur: NEGATIVE mg/dL
NITRITE: NEGATIVE
PH: 5 (ref 5.0–8.0)
Protein, ur: 100 mg/dL — AB
SPECIFIC GRAVITY, URINE: 1.025 (ref 1.005–1.030)
WBC, UA: >50 WBC/hpf — ABNORMAL HIGH (ref 0–5)

## 2018-04-16 MED ORDER — SULFAMETHOXAZOLE-TRIMETHOPRIM 800-160 MG PO TABS: 1.0000 | ORAL_TABLET | Freq: Two times a day (BID) | ORAL | 0 refills | Status: AC

## 2018-04-16 MED ORDER — ADALIMUMAB 40 MG/0.8ML ~~LOC~~ PSKT: 1.0000 | PREFILLED_SYRINGE | SUBCUTANEOUS | 3 refills | Status: DC

## 2018-04-16 NOTE — Telephone Encounter (Signed)
Gerald Valentine, the UA has resulted. Please advise!

## 2018-04-16 NOTE — Telephone Encounter (Addendum)
Pt called for results on the Humira level and was informed.  He is taking the Entocort daily. The Rx for Humira should be sent to [facility].  Pt said he also has another UTI, related to the fistula.  He has burning and urgency. He would like another round of antibiotics since it is right at Christmas. He has appt here early Jan.  I spoke to Roseanne Kaufman, NP and she said to order a UA and culture. Pt is aware and I have faxed the orders to [facility] lab. He will come on to the lab right away.

## 2018-04-16 NOTE — Telephone Encounter (Signed)
I'm empirically starting on Bactrim DS 1 po BID for 7 days. Imperative that if he has fever/chills, suprapubic pain that worsens, etc., then he needs to seek medical care as this could mean a more complicated presentation.   UA with pyuria. Awaiting culture. Will tailor antibiotics as appropriate when returns.

## 2018-04-16 NOTE — Telephone Encounter (Signed)
I sent Humira to the appropriate pharmacy [facility]).

## 2018-04-16 NOTE — Telephone Encounter (Signed)
PT is aware.

## 2018-04-18 LAB — URINE CULTURE: Culture: >=100000 — AB

## 2018-04-19 ENCOUNTER — Telehealth: Payer: Self-pay | Admitting: Gastroenterology

## 2018-04-19 MED ORDER — FOSFOMYCIN TROMETHAMINE 3 G PO PACK: PACK | ORAL | 0 refills | Status: DC

## 2018-04-19 NOTE — Telephone Encounter (Signed)
SEE TC DEC 26.

## 2018-04-19 NOTE — Telephone Encounter (Signed)
  Called patient TO DISCUSS RESULTS. LVM-CALL [phone] TO DISCUSS. EXPLAINING RESULTS. DISCUSSED WITH DR. Leonor Liv COMER. RECOMMENDED FOSFOMYCIN TODAY AND REPEAT IN 3 DAYS.

## 2018-04-20 ENCOUNTER — Encounter: Payer: Self-pay | Admitting: Gastroenterology

## 2018-04-23 NOTE — Telephone Encounter (Signed)
Noted. Appreciate the assistance. Doris, can we check on him to see how he is doing?

## 2018-04-23 NOTE — Telephone Encounter (Signed)
Pt received Dr. Oneida Alar message and took first dose of the Fosfomycin. He is feeling much better and will take the second dose today.

## 2018-04-23 NOTE — Telephone Encounter (Signed)
Great!

## 2018-04-23 NOTE — Progress Notes (Signed)
NO PCP PER PATIENT

## 2018-04-24 NOTE — Telephone Encounter (Signed)
REVIEWED-NO ADDITIONAL RECOMMENDATIONS. 

## 2018-05-02 ENCOUNTER — Telehealth: Payer: Self-pay | Admitting: *Deleted

## 2018-05-02 ENCOUNTER — Encounter: Payer: Self-pay | Admitting: Gastroenterology

## 2018-05-02 ENCOUNTER — Telehealth: Payer: Self-pay | Admitting: Gastroenterology

## 2018-05-02 ENCOUNTER — Ambulatory Visit: Payer: BLUE CROSS/BLUE SHIELD | Admitting: Gastroenterology

## 2018-05-02 VITALS — BP 138/80 | HR 85 | Temp 97.0°F | Ht 72.0 in | Wt 178.4 lb

## 2018-05-02 DIAGNOSIS — K50013 Crohn's disease of small intestine with fistula: Secondary | ICD-10-CM

## 2018-05-02 DIAGNOSIS — K50819 Crohn's disease of both small and large intestine with unspecified complications: Secondary | ICD-10-CM

## 2018-05-02 NOTE — Telephone Encounter (Signed)
Spoke to pt, informed him of appt at [facility] surgery.

## 2018-05-02 NOTE — Telephone Encounter (Signed)
Patient returned call, please give a call back

## 2018-05-02 NOTE — Telephone Encounter (Signed)
Patient will be added to cancellation list with Dr. Drue Flirt

## 2018-05-02 NOTE — Patient Instructions (Signed)
1. Continue Humira ONCE WEEKLY. I will check to make sure your RX matches the change. 2. Decrease Entocort to 67m (two tabs) daily for 3 weeks. Then drop to 360m(1 tab) daily until you run out of medication.  3. [facility]

## 2018-05-02 NOTE — Progress Notes (Addendum)
REVIEWED. PT ALSO NEED REFERRAL TO [facility] IBD SPECIALIST, DX: FISTULIZING CROHN'S DISEASE.   Primary Care Physician: Patient, No Pcp Per  Primary Gastroenterologist:  Barney Drain, MD   Chief Complaint  Patient presents with  . HFU    Terminal ileitis of small intestine, with fistula. Reports keeps having reccurent UTI    HPI: Gerald Valentine is a 33 y.o. male with history of ileocolonic Crohn's disease, here for hospital follow-up. Previously on Pentasa in the remote past until 2015, then Remicade but developed allergic reaction, presenting to [facility] in Jan 2017 without any therapy for several years. Humira was started in May 2017, following colonoscopy by Dr. Oneida Alar showing stricture in distal ileum due to Crohn's. pseudopolyp in descending colon. rectum pathology with active colitis, left and ascending colon with quiescent colitis, rectum with active colitis. Repeat colonoscopy in 04/2017 as below.   He presented to the hospital back in December with complaints of fever, burning with urination, malaise and body aches.  Urinalysis showed moderate blood, protein, 21-50 WBCs, rare bacteria.  Urine culture had no growth.  CRP was over 20 had been 2.5 two years ago.  He had mild anemia with hemoglobin of 10.5, B12 and folate were normal.  Iron 13, iron saturations 10%, TIBC 129, ferritin 293.  Likely anemia of chronic disease but cannot exclude element of iron deficiency as well.  CT demonstrated new evidence of fistula involving the terminal ileum and sigmoid colon and unable to exclude possible enterovesicular fistula.  He had marked bladder wall thickening along the superior bladder wall, possibly secondarily inflamed from inflamed adjacent bowel but fistula courses near the superior bladder wall and difficult to exclude enterovesicular fistula.  He had markedly inflamed and thickened terminal ileum.    Colonoscopy January 2019 Stricture at the ileocecal valve. - Simple Endoscopic Score  for Crohn's Disease: total aggregate score was 1, mucosal inflammatory changes secondary to Crohn's disease, in remission. Biopsied. - Redundant colon. - The examination was otherwise normal on direct and retroflexion views. -Colon and rectal biopsies were all normal with no evidence of active disease -Next colonoscopy in 1 to 2 years  He had Humira labs drawn while in the hospital in December.  His drug level was 4.2 less than target.  He had no antibodies.  He was advised to to increase Humira to q. weekly dosing.  He has been on q. weekly dosing at least 3 weeks.  He is also on Entocort 9 mg daily, for the past 4 weeks.  He called with urinary symptoms since discharge from the hospital.  Urine culture grew E. coli greater than 100,000 colony-forming units on December 23.  He had a resistant organism, sensitive to imipenem and pip/taz only.  ID was consulted.  They recommended fosfomycin for 4 days.  Patient reports that his urinary symptoms resolved.  He lost 15 pounds with his illness, prior to hospitalization.  He is doing better.  His weight is almost back to baseline.  Has 2 bowel movements daily.  No blood in the stool.  Appetite is improved.  No abdominal pain.  Current Outpatient Medications  Medication Sig Dispense Refill  . Adalimumab (HUMIRA) 40 MG/0.8ML PSKT Inject 0.8 mLs (40 mg total) into the skin every 7 (seven) days. 4 each 3  . budesonide (ENTOCORT EC) 3 MG 24 hr capsule Take 3 capsules (9 mg total) by mouth daily. 60 capsule 1   No current facility-administered medications for this visit.  Allergies as of 05/02/2018 - Review Complete 05/02/2018  Allergen Reaction Noted  . Remicade [infliximab] Shortness Of Breath 05/11/2017   Past Medical History:  Diagnosis Date  . Crohn's disease of both small and large intestine [facility])    Past Surgical History:  Procedure Laterality Date  . BIOPSY  05/09/2017   Procedure: BIOPSY;  Surgeon: [name], MD;  Location: [facility]  ENDO SUITE;  Service: Endoscopy;;  colon  . COLONOSCOPY    . COLONOSCOPY N/A 07/10/2015   Dr. Oneida Alar: stricture in distal ileum due to Crohn's. pseudopolyp in descending colon. rectum pathology with active colitis, left and ascending colon with quiescent colitis, rectum with active colitis. 6 month Surveillance  needed after starting Humira  (which was May 2017)   . COLONOSCOPY WITH PROPOFOL N/A 05/09/2017   stricture at IC valve, redundant colon, no colonic ulcerations visibly seen, multiple biopsies of colon with benign colonic mucosa  . None to date     As of 05/18/15   Family History  Problem Relation Age of Onset  . Hypertension Mother   . Liver cancer Father        deceased age 74  . CVA Father   . Colon cancer Neg Hx    Social History   Tobacco Use  . Smoking status: Never Smoker  . Smokeless tobacco: Never Used  Substance Use Topics  . Alcohol use: No    Alcohol/week: 0.0 standard drinks  . Drug use: No    ROS:  General: Negative for anorexia, weight loss, fever, chills, fatigue, weakness. ENT: Negative for hoarseness, difficulty swallowing , nasal congestion. CV: Negative for chest pain, angina, palpitations, dyspnea on exertion, peripheral edema.  Respiratory: Negative for dyspnea at rest, dyspnea on exertion, cough, sputum, wheezing.  GI: See history of present illness. GU:  Negative for dysuria, hematuria, urinary incontinence, urinary frequency, nocturnal urination.  Endo: Negative for unusual weight change.    Physical Examination:   BP 138/80   Pulse 85   Temp (!) 97 F (36.1 C) (Oral)   Ht 6' (1.829 m)   Wt 178 lb 6.4 oz (80.9 kg)   BMI 24.20 kg/m   General: Well-nourished, well-developed in no acute distress.  Eyes: No icterus. Mouth: Oropharyngeal mucosa moist and pink , no lesions erythema or exudate. Lungs: Clear to auscultation bilaterally.  Heart: Regular rate and rhythm, no murmurs rubs or gallops.  Abdomen: Bowel sounds are normal, nontender,  nondistended, no hepatosplenomegaly or masses, no abdominal bruits or hernia , no rebound or guarding.   Extremities: No lower extremity edema. No clubbing or deformities. Neuro: Alert and oriented x 4   Skin: Warm and dry, no jaundice.   Psych: Alert and cooperative, normal mood and affect.  Labs:  Lab Results  Component Value Date   CREATININE 1.02 03/28/2018   BUN 9 03/28/2018   NA 137 03/28/2018   K 4.6 03/28/2018   CL 108 03/28/2018   CO2 24 03/28/2018   Lab Results  Component Value Date   ALT 12 03/26/2018   AST 14 (L) 03/26/2018   ALKPHOS 73 03/26/2018   BILITOT 1.0 03/26/2018   Lab Results  Component Value Date   WBC 5.8 03/28/2018   HGB 10.5 (L) 03/28/2018   HCT 34.7 (L) 03/28/2018   MCV 91.6 03/28/2018   PLT 367 03/28/2018   Lab Results  Component Value Date   IRON 13 (L) 03/27/2018   TIBC 129 (L) 03/27/2018   FERRITIN 293 03/27/2018   Lab  Results  Component Value Date   VITAMINB12 215 03/27/2018   Lab Results  Component Value Date   FOLATE 7.4 03/27/2018    Imaging Studies: No results found.

## 2018-05-02 NOTE — Telephone Encounter (Signed)
Patient scheduled to see Dr. Drue Flirt at [facility] Surgery dept on 06/12/2018 at 9:30am. They will mail patient a packet in the mail with complete appt details/directions. Records faxed.  Called patient, VM full.

## 2018-05-02 NOTE — Telephone Encounter (Signed)
See other phone note re: referral.

## 2018-05-04 ENCOUNTER — Telehealth: Payer: Self-pay | Admitting: Gastroenterology

## 2018-05-04 NOTE — Telephone Encounter (Signed)
Please send this week's office note, CT reports from February 2017 and December 2019 to [facility] surgery department

## 2018-05-04 NOTE — Progress Notes (Signed)
No pcp per patient 

## 2018-05-04 NOTE — Telephone Encounter (Signed)
Notes were faxed. Confirmation received

## 2018-05-04 NOTE — Assessment & Plan Note (Addendum)
Very pleasant 33 year old gentleman with history of ileocolonic Crohn's disease initially diagnosed at age 66.  Colonoscopy in January 2017 when he presented to our practice after being off of therapy for several years showed a stricture in the distal ileum, pathology showed active colitis involving rectum, left colon.  Quiescent colitis of the ascending colon.  He was started on Humira 40 mg every 2 weeks at that time.  Follow-up colonoscopy January 2019 showed stricture at the ileocecal valve, colon and rectal biopsies normal.  Plans for next colonoscopy in 1 to 2 years.  Patient recently presented with urinary symptoms.  Initial urine culture negative.  Subsequently had urine culture on December 23 growing resistant E. coli, treated with fosfomycin.  CT in December demonstrated new evidence of fistula involving the terminal ileum and sigmoid colon, fistula coursed near the superior bladder wall, difficult to exclude enterovesicular fistula.  No air seen in the bladder.  The superior bladder wall was thickened.  Clinically, patient is feeling better.  Concern for possible enterovesicular fistula giving UTI in a male, and CT findings.  Given low Humira drug level, his dose was increased to q. weekly.  His appetite is improved.  Currently without urinary symptoms.  We will start Entocort taper.  Referral to [facility] surgery for consideration of surgical repair of fistula.    Repeat labs 8 weeks, CBC, iron/ferritin, CRP, Humira labs.

## 2018-05-10 NOTE — Telephone Encounter (Signed)
SEEN IN [facility] JAN 8 REFER TO [facility] GI AND SURGERY DEPTs.

## 2018-05-11 ENCOUNTER — Telehealth: Payer: Self-pay | Admitting: Gastroenterology

## 2018-05-11 DIAGNOSIS — K50819 Crohn's disease of both small and large intestine with unspecified complications: Secondary | ICD-10-CM

## 2018-05-11 NOTE — Telephone Encounter (Signed)
Please let patient know Dr. Oneida Alar also wants him to be referred to [place] IBD specialist for Dx: fistulizing Crohn's disease.   He needs to see both surgery and GI. Referral for surgery already ordered.

## 2018-05-14 NOTE — Telephone Encounter (Signed)
Called [facility] GI, office is closed today for holiday.

## 2018-05-14 NOTE — Telephone Encounter (Signed)
Referrals are being made to both GI and surgery at [facility].

## 2018-05-15 NOTE — Telephone Encounter (Signed)
Called [facility] for referral. Appt scheduled for 06/14/18 at 9:00am with Dr. Nyoka Cowden. They will mail packet to pt.  Called and informed pt of appt. He wants to see if they can schedule same day as his general surgery appt. Gave him phone number to [facility] GI.

## 2018-05-15 NOTE — Addendum Note (Signed)
Addended by: Hassan Rowan on: 05/15/2018 02:41 PM   Modules accepted: Orders

## 2018-07-13 ENCOUNTER — Telehealth: Payer: Self-pay

## 2018-07-13 NOTE — Telephone Encounter (Addendum)
Pt is due lab work to recheck levels and check antibodies ( On Humira). Per Roseanne Kaufman, NP, he needs to do the blood work the [place] his next dose of Humira.  I called and left VM that I have a very important message for the pt. I am also mailing a letter for him to call.  He will need to come by here and pick up kit for his [facility] lab. I will leave the kit on my desk til pick up, since I have to fill in date of next injection on the form.  He can ONLY GO TO [facility] LAB for this blood work.

## 2018-07-16 NOTE — Telephone Encounter (Signed)
PT called and said his next dose is due 07/18/2018 ( WED). He will come by the office tomorrow morning ( TUES) and pick up kit and take to [facility] lab.  

## 2018-07-16 NOTE — Telephone Encounter (Signed)
LMOM to call.

## 2018-07-17 ENCOUNTER — Other Ambulatory Visit (HOSPITAL_COMMUNITY)
Admission: RE | Admit: 2018-07-17 | Discharge: 2018-07-17 | Disposition: A | Discharge status: Home / Self Care | Payer: BLUE CROSS/BLUE SHIELD | Source: Ambulatory Visit | Attending: Gastroenterology | Admitting: Gastroenterology

## 2018-07-17 DIAGNOSIS — K50819 Crohn's disease of both small and large intestine with unspecified complications: Secondary | ICD-10-CM | POA: Insufficient documentation

## 2018-07-25 HISTORY — PX: RIGHT COLECTOMY: SHX853

## 2018-08-22 ENCOUNTER — Telehealth: Payer: Self-pay | Admitting: Gastroenterology

## 2018-08-22 NOTE — Telephone Encounter (Signed)
Spoke with pt. He said he isn't receiving vitamin B injections and hasn't heard it mentioned to him. He would like to know would this be something he does, will he need a prescription for it or where it will be done?  I mentioned possible discussing this with his [facility] doctor next week, pt didn't say if he was going to that appointment.

## 2018-08-22 NOTE — Telephone Encounter (Signed)
Records reviewed from [facility].  Initial consultation with Dr. Nyoka Cowden June 06, 2018 for fistulizing small bowel Crohn's disease.  Agree with need for surgical intervention for fistula.  Humira drug level/antibodies obtained.  Consideration for switching to Stelara if needed (ie has antibodies or high drug level).  Labs from June 06, 2018: Sed rate 42, CRP 25.8.  Ferritin 30, iron 20, TIBC 232, iron saturations 9%, B12 102 low, TB Gold plus negative, hepatitis B core total nonreactive, adalimumab drug level 12.3   CT enterography, June 21, 2018 information 38GY enterocolic and in the interim his old fistula.  Partial small bowel obstruction likely secondary to combination of active disease as well as fibrotic changes in the distal ileum.  Abscess formation.  Bladder wall thickening and air within the bladder likely secondary to the enterovesical fistula.  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  Patient readmitted for partial small bowel obstruction, treated conservatively.   Labs from 08/02/2018: White blood cell count 8000, hemoglobin 14.6, platelets 3 42,000, sodium 135, potassium 4.4, chloride 96, creatinine 0.98, alkaline phosphatase 116, total bilirubin 0.6, AST 17, ALT 16, albumin 4.4.  CT abdomen pelvis with contrast 08/02/2018: Postsurgical changes from ileocecal resection and creation of end ileostomy.  Dilated loop of distal ileum just proximal to the ileostomy site concerning for functional small bowel obstruction at the ileostomy site.  Adjacent edema/inflammatory changes could relate to bowel obstruction and/or inflammatory/infectious enteritis.  Questionable transition point proximal to the distended loop of bowel, which may reflect the presence of adhesions given the short-term bowel in  this area.  Proximal loops of small bowel are mildly dilated with air-fluid levels,?  Mild early partial small bowel obstruction at the transition point.  Postsurgical changes from enterovesical fistula takedown with residual posterior bladder wall thickening and adjacent stranding.    Humira labs dated 07/18/2018 ordered through our office, trough level.  Adalimumab drug level 18.8, no antibodies detected.

## 2018-08-22 NOTE — Telephone Encounter (Signed)
Spoke to patient. Humira stopped week of surgery.  He is not sure whether he is going to be going back on the drug.  He has a follow-up appointment with [facility] GI group on May 6th, phone visit. He was given verbal Humira lab results and I asked that he share his drug level with them at his visit.   SUSAN, PLEASE SEND COPY OF PROMETHEUS LABS TO [place] [facility] AS SOON AS YOU CAN. PATIENT HAS OV 08/29/2018.   FYI [facility].

## 2018-08-22 NOTE — Telephone Encounter (Signed)
Please find out if patient is receiving B12 injections. He has low B12 AND had his terminal ileum removed and therefore will have poor ORAL absorption.   IF HE IS NOT RECEIVING B12 INJECTIONS WE NEED TO GET THOSE SCHEDULED FOR HIM. HE HAS FOLLOW UP AT [facility] NEXT WEEK, HE CAN ASK THEM OR WE CAN DO IT.

## 2018-08-22 NOTE — Telephone Encounter (Signed)
REVIEWED. AGREE. NO ADDITIONAL RECOMMENDATIONS. 

## 2018-08-23 MED ORDER — CYANOCOBALAMIN 1000 MCG/ML IJ SOLN: INTRAMUSCULAR | 1 refills | Status: DC

## 2018-08-23 NOTE — Addendum Note (Signed)
Addended by: [name] on: 08/23/2018 12:16 PM   Modules accepted: Orders

## 2018-08-23 NOTE — Telephone Encounter (Signed)
I faxed prometheus report to [facility] and sent to scanning

## 2018-08-23 NOTE — Telephone Encounter (Signed)
I can give RX for him to get at pharmacy. He can request pharmacy to provide instructions on how to take but he is familiar with Subcutaneous injections because he has done Humira before.   Please contact patient's pharmacy and make sure they can provide instructions to him as above.   RX sent to [facility] in [place].

## 2018-08-27 NOTE — Telephone Encounter (Signed)
Left a detailed message for the pharmacy. Waiting to hear back from them about instructing pt on vitamin B 12 injections.

## 2018-08-28 NOTE — Telephone Encounter (Signed)
Called pts pharmacy back at [place] . They received the order and will go over instructions with pt.

## 2018-08-29 NOTE — Telephone Encounter (Signed)
Lmom, waiting on a return call.  

## 2018-11-24 HISTORY — PX: ILEOSTOMY CLOSURE: SHX1784

## 2018-12-20 ENCOUNTER — Telehealth: Payer: Self-pay

## 2018-12-20 NOTE — Telephone Encounter (Signed)
I have submitted PA request for Humira.

## 2018-12-25 NOTE — Telephone Encounter (Addendum)
I received a fax from [place] [id number] case gas been cancelled for Humira. Said to call [facility] @ [phone]. I called and spoke to May who directed me to [place]. Phone: [phone] Fax:  [phone] I called and spoke to [facility] and she said their direct line is [phone]. The Humira is ready for delivery and they have reached out to the pt on 12/19/2018 and today. I told her I left Vm for a return call from pt. I will inform pt to call them if I hear from him.

## 2019-01-14 ENCOUNTER — Telehealth: Payer: Self-pay | Admitting: Gastroenterology

## 2019-01-14 NOTE — Telephone Encounter (Signed)
Patient needs follow up with [facility] only. Re: Stelara treatment recommended by Dr. Eduard Roux

## 2019-01-17 ENCOUNTER — Encounter: Payer: Self-pay | Admitting: Gastroenterology

## 2019-01-17 NOTE — Telephone Encounter (Signed)
PATIENT SCHEDULED AND LETTER SENT  °

## 2019-02-07 ENCOUNTER — Ambulatory Visit: Payer: BLUE CROSS/BLUE SHIELD | Admitting: Gastroenterology

## 2019-02-07 ENCOUNTER — Encounter: Payer: Self-pay | Admitting: Gastroenterology

## 2019-02-07 ENCOUNTER — Other Ambulatory Visit: Payer: Self-pay

## 2019-02-07 DIAGNOSIS — K50819 Crohn's disease of both small and large intestine with unspecified complications: Secondary | ICD-10-CM

## 2019-02-07 NOTE — Assessment & Plan Note (Signed)
SURGICAL REMISSION. DR. Nyoka Cowden RECOMMENDED Delsa Grana.  COMPLETE THE FLU SHOT. COMPLETE THE HEPATITIS AB VACCINE. COMPLETE THE PNEUMOVAX. COMPLETE LABS. ONCE I RECEIVE THE LABS, I WILL SEND PRESCRIPTION FOR STELARA. FOLLOW UP IN 4 MOS.

## 2019-02-07 NOTE — Progress Notes (Signed)
   Subjective:    Patient ID: Gerald Valentine, male    DOB: 1986-04-18, 33 y.o.   MRN: [id number]  Patient, No Pcp Per  HPI NOT TAKING MEDS. REGULAR BMs. HAD lap assisted closure of end ileostomy with creation of end-to-side ileocolostomy AUG 2020. WORKING AT [employer]. USED BATHROOM FREQUENTLY. OFF BIOLOGICS MAR 2020.   PT DENIES FEVER, CHILLS, HEMATOCHEZIA, HEMATEMESIS, nausea, vomiting, melena, diarrhea, CHEST PAIN, SHORTNESS OF BREATH, CHANGE IN BOWEL IN HABITS, constipation, abdominal pain, problems swallowing, problems with sedation, OR heartburn or indigestion.  Past Medical History:  Diagnosis Date  . Crohn's disease of both small and large intestine [facility])    Past Surgical History:  Procedure Laterality Date  . BIOPSY  05/09/2017   Procedure: BIOPSY;  Surgeon: [name], MD;  Location: [facility];  Service: Endoscopy;;  colon  . COLONOSCOPY    . COLONOSCOPY N/A 07/10/2015   Dr. Oneida Alar: stricture in distal ileum due to Crohn's. pseudopolyp in descending colon. rectum pathology with active colitis, left and ascending colon with quiescent colitis, rectum with active colitis. 6 month Surveillance  needed after starting Humira  (which was May 2017)   . COLONOSCOPY WITH PROPOFOL N/A 05/09/2017   stricture at IC valve, redundant colon, no colonic ulcerations visibly seen, multiple biopsies of colon with benign colonic mucosa  . None to date     As of 05/18/15   Allergies  Allergen Reactions  . Remicade [Infliximab] Shortness Of Breath   Current Outpatient Medications  Medication Sig    .      .      .       Review of Systems PER HPI OTHERWISE ALL SYSTEMS ARE NEGATIVE.    Objective:   Physical Exam Constitutional:      General: He is not in acute distress.    Appearance: Normal appearance.  HENT:     Mouth/Throat:     Comments: MASK IN PLACE Eyes:     General: No scleral icterus.    Pupils: Pupils are equal, round, and reactive to light.  Neck:     Musculoskeletal:  Normal range of motion.  Cardiovascular:     Rate and Rhythm: Normal rate and regular rhythm.     Pulses: Normal pulses.     Heart sounds: Normal heart sounds.  Pulmonary:     Effort: Pulmonary effort is normal.     Breath sounds: Normal breath sounds.  Abdominal:     General: Bowel sounds are normal.     Palpations: Abdomen is soft.     Tenderness: There is no abdominal tenderness.  Musculoskeletal:     Right lower leg: No edema.     Left lower leg: No edema.  Lymphadenopathy:     Cervical: No cervical adenopathy.  Skin:    General: Skin is warm and dry.  Neurological:     Mental Status: He is alert and oriented to person, place, and time.     Comments: NO  NEW FOCAL DEFICITS  Psychiatric:        Mood and Affect: Mood normal.     Comments: NORMAL AFFECT       Assessment & Plan:

## 2019-02-07 NOTE — Progress Notes (Signed)
No pcp per patient 

## 2019-02-07 NOTE — Patient Instructions (Addendum)
COMPLETE THE FLU SHOT.  COMPLETE THE HEPATITIS AB VACCINE.  COMPLETE THE PNEUMOVAX.  COMPLETE LABS.   ONCE I RECEIVE THE LABS, I WILL SEND PRESCRIPTION FOR STELARA.  FOLLOW UP IN 4 MOS.

## 2019-02-07 NOTE — Progress Notes (Signed)
ON RECALL  °

## 2019-03-19 ENCOUNTER — Telehealth: Payer: Self-pay

## 2019-03-19 NOTE — Telephone Encounter (Addendum)
Pt said he went last week to get his labs done ( dated from Oct). He said all of the labs were not covered and he did not have any done, so he will not have another draw fee. Pt has [employer].  I spoke to [facility] and she said only 2 of the labs were covered. The Hep B core antibody and ferritin were covered. Not covered were:  Hep B surface antigen Vit D 25 Hydroxy Vit B12 Varicella Zoster antibody Measles, Mumps and Rubella  Dr. Oneida Alar, lab only had one diagnosis code. Please advise for diagnosis codes for the labs that were not covered.  Pt said he is supposed to start [facility] when Dr. Oneida Alar gets these results He has been off Humira since March.

## 2019-03-25 NOTE — Telephone Encounter (Signed)
CALL [employer] AND ASK THEM HOW THESE LABS NEED TO BE CODED. LET THEM KNOW HE NEEDS A B12 LEVEL BECAUSE HE HAD AN ILEAL RESECTION. HE NEEDS VITAMIN D level BECAUSE HE'S AT RISK FOR OSTEOPOROSIS DUE TO STEROID USE. HE NEEDS HEP B sAg, VZV Ab, AND MMR because he has Crohn's disease and is about start immunosuppressant therapy AND WE NEED TO KNOW HIS TITERS PRIOR TO STARTING THERAPY.  Pt should not start [employer] ARE FINAL.

## 2019-03-27 ENCOUNTER — Telehealth: Payer: Self-pay

## 2019-03-27 NOTE — Telephone Encounter (Signed)
I received a call from [facility] [place] and confirmed that pt is no longer on Humira.  They asked what he would be taking and I told them Stelera after labs, etc.  I was informed that his insurance requires him to use [employer].   The phone number is [phone].

## 2019-03-27 NOTE — Telephone Encounter (Signed)
I called [employer] ( [employer]) , the number on pt's insurance card to discuss the codes needed for the labs.  I spoke to Ron  ( reference number [id number]).  He told me I would need to call the local [employer] for [place] to get assistance [phone]).

## 2019-04-02 NOTE — Telephone Encounter (Signed)
REVIEWED-NO ADDITIONAL RECOMMENDATIONS. 

## 2019-04-02 NOTE — Telephone Encounter (Signed)
Pt had labs drawn today

## 2019-04-02 NOTE — Telephone Encounter (Signed)
I have spoke to [facility] at the lab and she is aware the codes for the labs are:   Z11.59 for Hep B surface antigen  Z79.899 for Vit D 24 Hydroxy and Vit B12  Z13.89 for Varicella Zoster Antibody and Measles Mumps and Rubella.   She will call pt to come and get his labs.

## 2019-04-03 ENCOUNTER — Telehealth: Payer: Self-pay | Admitting: Gastroenterology

## 2019-04-03 ENCOUNTER — Other Ambulatory Visit: Payer: Self-pay | Admitting: Gastroenterology

## 2019-04-03 LAB — VITAMIN B12: Vitamin B-12: 238 pg/mL (ref 200–1100)

## 2019-04-03 LAB — VITAMIN D 25 HYDROXY (VIT D DEFICIENCY, FRACTURES): Vit D, 25-Hydroxy: 15 ng/mL — ABNORMAL LOW (ref 30–100)

## 2019-04-03 LAB — MEASLES/MUMPS/RUBELLA IMMUNITY
Mumps IgG: 26.1 AU/mL
Rubella: 8.66 Index
Rubeola IgG: >300 AU/mL

## 2019-04-03 LAB — HEPATITIS B SURFACE ANTIGEN: Hepatitis B Surface Ag: NONREACTIVE

## 2019-04-03 LAB — VARICELLA ZOSTER ANTIBODY, IGG: Varicella IgG: 2288 index

## 2019-04-03 LAB — HEPATITIS B CORE ANTIBODY, IGM: Hep B C IgM: NONREACTIVE

## 2019-04-03 LAB — FERRITIN: Ferritin: 7 ng/mL — ABNORMAL LOW (ref 38–380)

## 2019-04-03 MED ORDER — CALCIUM CARBONATE ANTACID 500 MG PO CHEW: 1.0000 | CHEWABLE_TABLET | Freq: Three times a day (TID) | ORAL | 11 refills | Status: DC

## 2019-04-03 MED ORDER — VITAMIN D (ERGOCALCIFEROL) 1.25 MG (50000 UNIT) PO CAPS: 50000.0000 [IU] | ORAL_CAPSULE | ORAL | 0 refills | Status: DC

## 2019-04-03 MED ORDER — VITAMIN D 25 MCG (1000 UNIT) PO TABS: 2000.0000 [IU] | ORAL_TABLET | Freq: Every day | ORAL | 11 refills | Status: DC

## 2019-04-03 NOTE — Telephone Encounter (Signed)
LMOM to call.

## 2019-04-03 NOTE — Telephone Encounter (Addendum)
PLEASE CALL PT.     1. HIS VITAMIN D LEVEL IS LOW. HE NEEDS SUPPLEMENTS: VITAMIN D AND CALCIUM. HE NEEDS VITAMIN D 50,000 UNITS A WEEK FOR 8 WEEKS THEN START VITAMIN D 2000 UNITS DAILY FOREVER. HE NEEDS TO START CALCIUM 500 MG THREE TIMES A DAY FOREVER. THE LOWER DOSE OF VITAMIN D AND THE CALCIUM ARE AVAILABLE OTC.    2. HIS VITAMIN B 12 IS LOW NORMAL. HE SHOULD TAKE VITAMIN B12 1000 MCG DAILY.   3. HE HAS HAD CHICKEN POX. NO ADDITIONAL VACCINE FOR SHINGLES NEEDED AT THIS TIME.    4. HE SHOULD LET ME KNOW IF HE IS EXPOSED TO TUBERCULOSIS.  THE PRESCRIPTION FOR STELARA WILL BE SUBMITTED TODAY.

## 2019-04-05 NOTE — Telephone Encounter (Signed)
Returned the pt's VM and left Vm for a return call to address.

## 2019-04-15 NOTE — Telephone Encounter (Signed)
Letter mailed to call with results.

## 2019-05-27 ENCOUNTER — Telehealth: Payer: Self-pay | Admitting: Gastroenterology

## 2019-05-27 NOTE — Telephone Encounter (Signed)
[phone] please call patient, he is wanting his lab results he had taken a few months ago

## 2019-05-28 ENCOUNTER — Encounter: Payer: Self-pay | Admitting: Gastroenterology

## 2019-05-28 NOTE — Telephone Encounter (Signed)
LMOM for a return call.  

## 2019-06-03 NOTE — Telephone Encounter (Signed)
LMOM to call.

## 2019-06-04 NOTE — Telephone Encounter (Signed)
Called and informed pt.  

## 2019-06-04 NOTE — Telephone Encounter (Signed)
PLEASE CALL PT. THE B12 IS OTC. THE STELARA WILL NEED TO GO THROUGH A  BIOPHARMACY SO IT WILL TAKE ABOUT 2-4 WEEKS TO ARRANGE.

## 2019-06-04 NOTE — Telephone Encounter (Signed)
See results note of 04/03/2019 I have talked to the pt today.

## 2019-06-04 NOTE — Telephone Encounter (Signed)
PT called for results and apologized since he is working long hours 7 days a week. He was informed of all of the results and plan. He will need a prescription for the Vitamin B12 and the Stelera.

## 2019-06-04 NOTE — Telephone Encounter (Signed)
I spoke with Gerald Valentine at [facility] and they are faxing me an updated form to send in for [facility].

## 2019-06-11 ENCOUNTER — Telehealth: Payer: Self-pay

## 2019-06-11 NOTE — Telephone Encounter (Signed)
[employer] paperwork on chair for Dr. Oneida Alar to complete for [facility].

## 2019-06-20 NOTE — Telephone Encounter (Signed)
STELARA PAPERWORK COMPLETE.

## 2019-06-20 NOTE — Telephone Encounter (Signed)
Faxed paperwork to Gerald Valentine.

## 2019-06-26 NOTE — Telephone Encounter (Signed)
[employer] called asking if the Quantity of pt's IV starting dose could be 4 vials. Paper faxed in said Qty 1 for 520 mg. Per [employer] pts weight is 88.6 and that equals 4 vials instead of qty 1. Spoke with AB, it's ok to adjust the qty based on pts weight.

## 2019-07-02 NOTE — Telephone Encounter (Signed)
OK TO CHANGE ORDER TO STELARA Q12 WEEKS. PLEASE EXPLAIN TO PT HIS INSURANCE COMPANY REQUIRES THE CHANGE IN DOSING FREQUENCY.

## 2019-07-02 NOTE — Telephone Encounter (Signed)
Stelara for pt has be denied. [employer] policy requires pt to try and fail 90 mg q 12 weeks for 6 months and have the dose be slowly increased before 90 mg q 8 weeks will be approved. An appeal can be done if [facility] doesn't agree with the decision [employer] has made. Please advise

## 2019-07-02 NOTE — Telephone Encounter (Signed)
Noted. Spoke with the [employer]. They asked me to fill out the appeal form with the corrected dosage. Waiting to see if it will be approved. Will discuss with pt once we hear back from [employer].

## 2019-07-17 ENCOUNTER — Telehealth: Payer: Self-pay

## 2019-07-17 NOTE — Telephone Encounter (Signed)
Received a call from [facility]. They would like an RX written for pts maintenance dose for Humira. Original forms were completed for [facility] and [facility] needs it sent to them.   RX can be faxed or sent electronically to [facility].

## 2019-07-18 NOTE — Telephone Encounter (Signed)
PLEASE CALL PT'S PHARMACY. PT HAS BEEN CHANGED TO STELARA.

## 2019-07-18 NOTE — Telephone Encounter (Signed)
Correction. They want the induction dose and maintenance dose of Stelara sent to [facility].

## 2019-07-19 NOTE — Telephone Encounter (Signed)
Noted. Faxed RX to [facility] as directed.

## 2019-07-19 NOTE — Telephone Encounter (Signed)
PLEASE FILL OUT AND FAX A PRESCRIPTION FOR THE FOLLOWING:  STELARA, 520 MG IV x1, THEN 90 MG SQ Q12 WEEKS, REFILL x 1 YEAR.

## 2019-07-25 NOTE — Telephone Encounter (Signed)
Induction dose info for Stelara was faxed to [facility]. Spoke with [facility] and pt will receive his maintenance dose from them. Pt will be contacted to schedule the induction dose at [facility].   Lmom, to discuss with pt.

## 2019-08-09 ENCOUNTER — Encounter (HOSPITAL_COMMUNITY): Admission: RE | Admit: 2019-08-09 | Payer: BC Managed Care – PPO | Source: Ambulatory Visit

## 2019-08-19 ENCOUNTER — Encounter (HOSPITAL_COMMUNITY): Admission: RE | Admit: 2019-08-19 | Payer: BC Managed Care – PPO | Source: Ambulatory Visit

## 2019-08-19 DIAGNOSIS — K5090 Crohn's disease, unspecified, without complications: Secondary | ICD-10-CM | POA: Insufficient documentation

## 2019-08-20 ENCOUNTER — Encounter (HOSPITAL_COMMUNITY): Payer: Self-pay

## 2019-08-20 ENCOUNTER — Encounter (HOSPITAL_COMMUNITY)
Admission: RE | Admit: 2019-08-20 | Discharge: 2019-08-20 | Disposition: A | Discharge status: Home / Self Care | Payer: BC Managed Care – PPO | Source: Ambulatory Visit | Attending: Psychiatry | Admitting: Psychiatry

## 2019-08-20 ENCOUNTER — Other Ambulatory Visit: Payer: Self-pay

## 2019-08-20 DIAGNOSIS — K5090 Crohn's disease, unspecified, without complications: Secondary | ICD-10-CM | POA: Diagnosis not present

## 2019-08-20 MED ORDER — SODIUM CHLORIDE 0.9 % IV SOLN: Freq: Once | INTRAVENOUS | Status: AC

## 2019-08-20 MED ORDER — ACETAMINOPHEN 325 MG PO TABS
650.0000 mg | ORAL_TABLET | Freq: Once | ORAL | Status: AC
  Administered 2019-08-20: 650 mg via ORAL

## 2019-08-20 MED ORDER — DIPHENHYDRAMINE HCL 50 MG/ML IJ SOLN
25.0000 mg | Freq: Once | INTRAMUSCULAR | Status: AC
  Administered 2019-08-20: 11:00:00 25 mg via INTRAVENOUS

## 2019-08-20 MED ORDER — USTEKINUMAB 130 MG/26ML IV SOLN
520.0000 mg | Freq: Once | INTRAVENOUS | Status: AC
Start: 2019-08-20 — End: 2019-08-20
  Administered 2019-08-20: 12:00:00 520 mg via INTRAVENOUS
  Filled 2019-08-20: qty 104

## 2019-12-04 ENCOUNTER — Telehealth: Payer: Self-pay

## 2019-12-04 NOTE — Telephone Encounter (Signed)
Received fax from [facility] requesting refill for Stelara 24m/ml syringe, quanity 1, inject 1 prefilled syringe subcutaneously every 12 weeks.

## 2019-12-06 NOTE — Telephone Encounter (Signed)
This isn't on his med list to refill, do we still have the fax? (It probably came from a specialty pharmacy and/or infusion center).   Also, please schedule patient for a follow-up (hasn't been seen since 01/2019)

## 2019-12-09 NOTE — Telephone Encounter (Signed)
Gerald Valentine, fax placed on your desk.  Gerald Valentine, please schedule OV per Gerald Valentine.

## 2019-12-11 ENCOUNTER — Encounter: Payer: Self-pay | Admitting: Internal Medicine

## 2019-12-11 NOTE — Telephone Encounter (Signed)
Noted. Fax was completed for refill.

## 2019-12-11 NOTE — Telephone Encounter (Signed)
OV made and letter mailed °

## 2019-12-23 NOTE — Telephone Encounter (Signed)
PA form, last ov notes and labs notes were faxed to [facility] this morning 12/23/19 [phone].

## 2020-01-16 ENCOUNTER — Ambulatory Visit: Payer: BC Managed Care – PPO | Admitting: Internal Medicine

## 2020-02-19 ENCOUNTER — Encounter: Payer: Self-pay | Admitting: Internal Medicine

## 2020-02-19 ENCOUNTER — Ambulatory Visit: Payer: Self-pay | Admitting: Internal Medicine

## 2020-02-27 ENCOUNTER — Telehealth: Payer: Self-pay

## 2020-02-27 NOTE — Telephone Encounter (Signed)
Received fax from [facility], prescription for Stelara is invalid. Called pharmacy per [place] NP request. Damaris Schooner to [facility] (Pharmacist), rx received 02/15/20 didn't have signature or date. Eric completed form. Form faxed to pharmacy.

## 2020-03-02 NOTE — Telephone Encounter (Signed)
Received fax from [facility], Stelara 25m has been approved from 02/26/20-02/25/21.

## 2020-04-25 IMAGING — CT CT ABD-PELV W/ CM
2 of 4 series · 15 of 46 positions shown, 17 images · IV contrast (Isovue)
Comparison: 06/03/2015

CLINICAL DATA: Dysuria.  History of Crohn's disease.  Fever.

EXAM:
CT ABDOMEN AND PELVIS WITH CONTRAST
TECHNIQUE: Multidetector CT imaging of the abdomen and pelvis was performed
using the standard protocol following bolus administration of
intravenous contrast.
CONTRAST:  100mL UBNIZ1-VPP IOPAMIDOL (UBNIZ1-VPP) INJECTION 61%

[Series 2: axial st · axial · 0.67mm/px · z∈[-705,-315]mm · 12 of 90 slices shown, 14 images]
[im 8/90  soft-tissue]
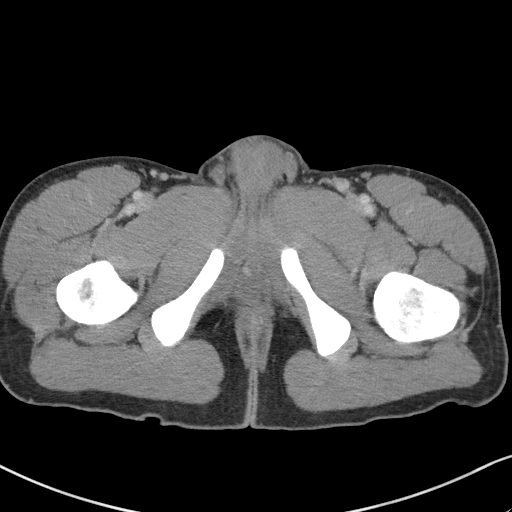
[im 8/90  bone]
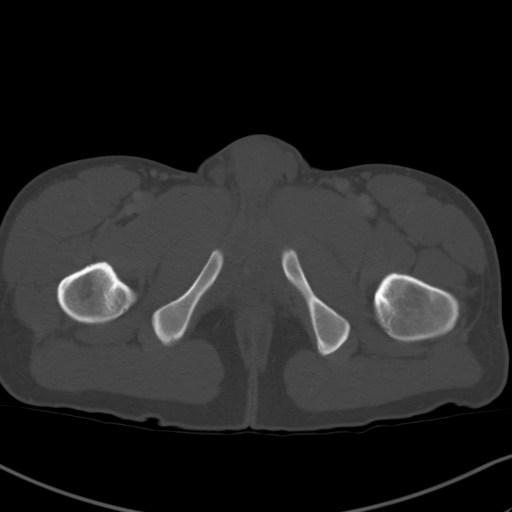
[im 15/90  soft-tissue]
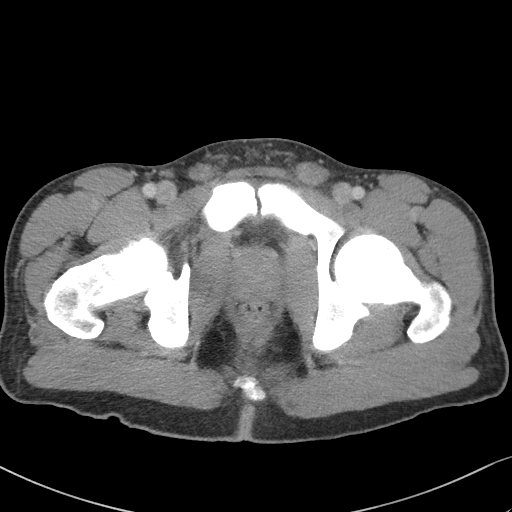
[im 22/90  soft-tissue]
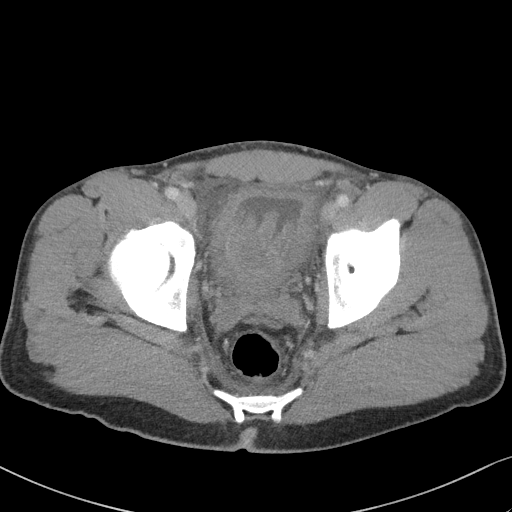
[im 29/90  soft-tissue]
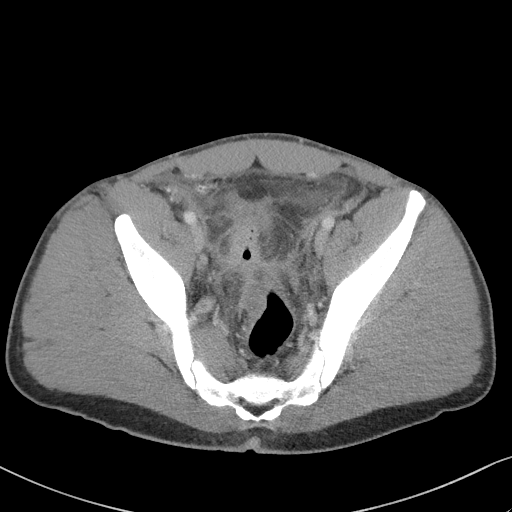
[im 36/90  soft-tissue]
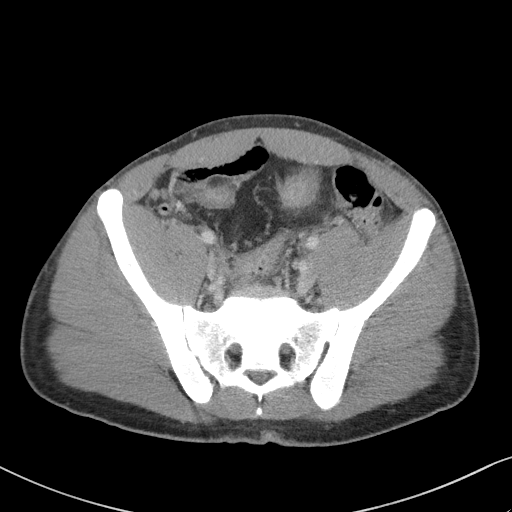
[im 43/90  soft-tissue]
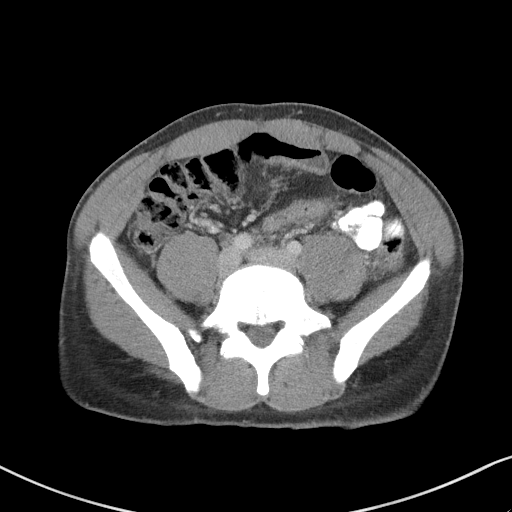
[im 50/90  soft-tissue]
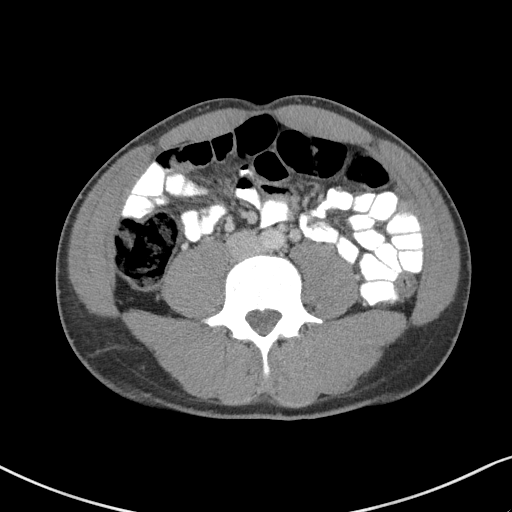
[im 57/90  soft-tissue]
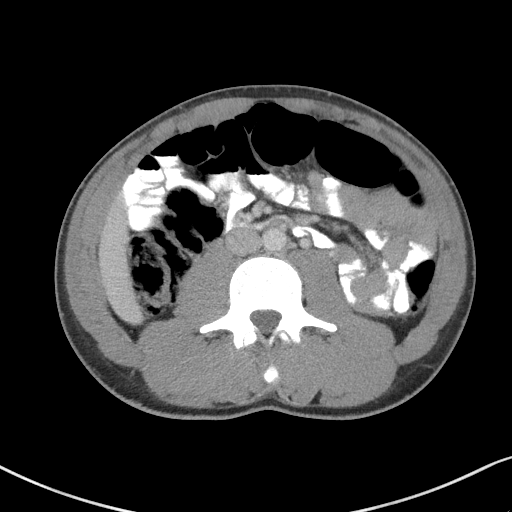
[im 65/90  soft-tissue]
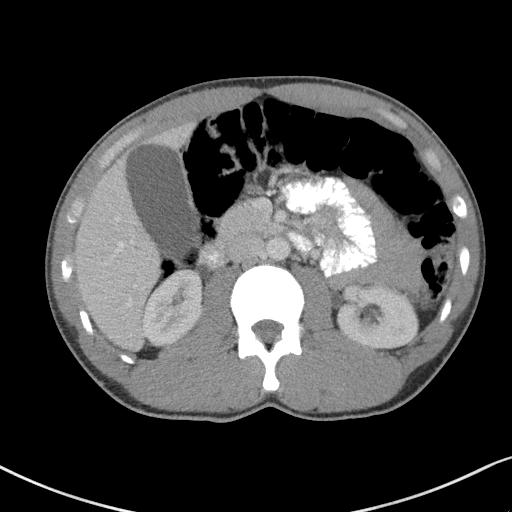
[im 65/90  bone]
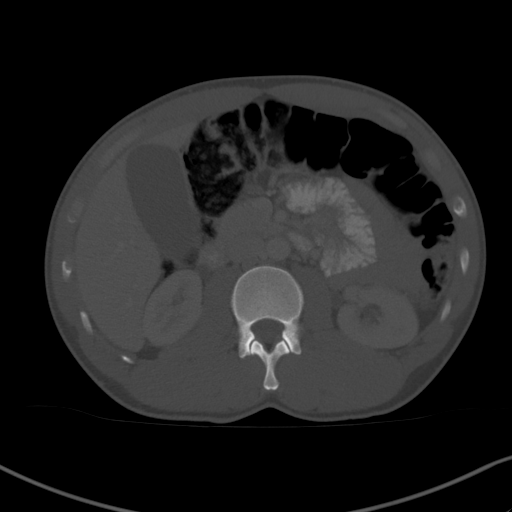
[im 72/90  soft-tissue]
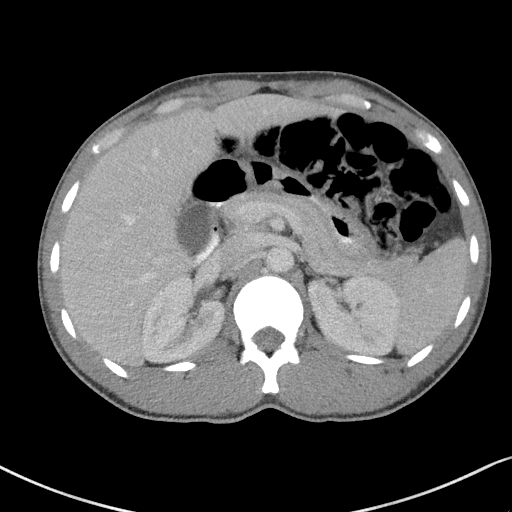
[im 79/90  soft-tissue]
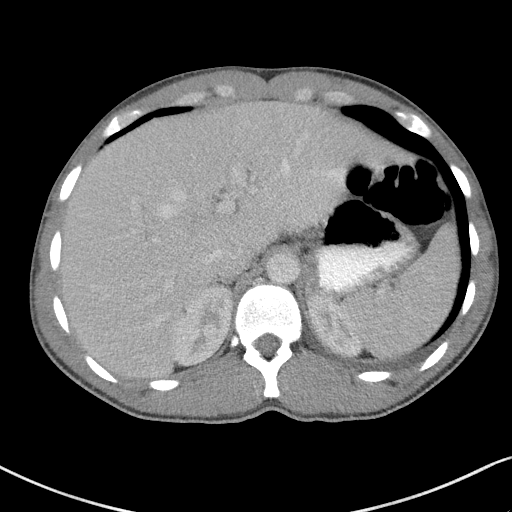
[im 86/90  soft-tissue]
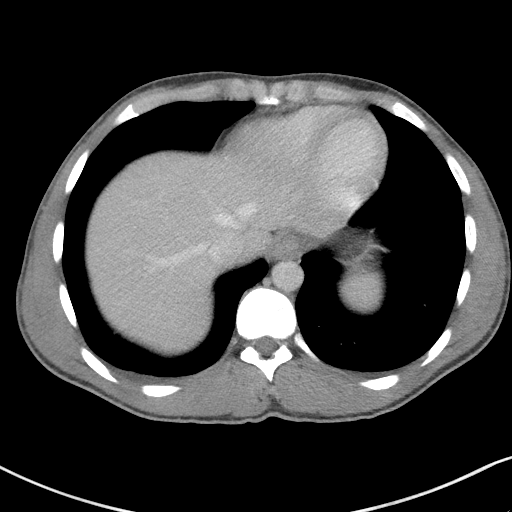

[Series 5: coronal st · coronal · 0.65mm/px · 3 of 85 slices shown]
[im 29/85  soft-tissue]
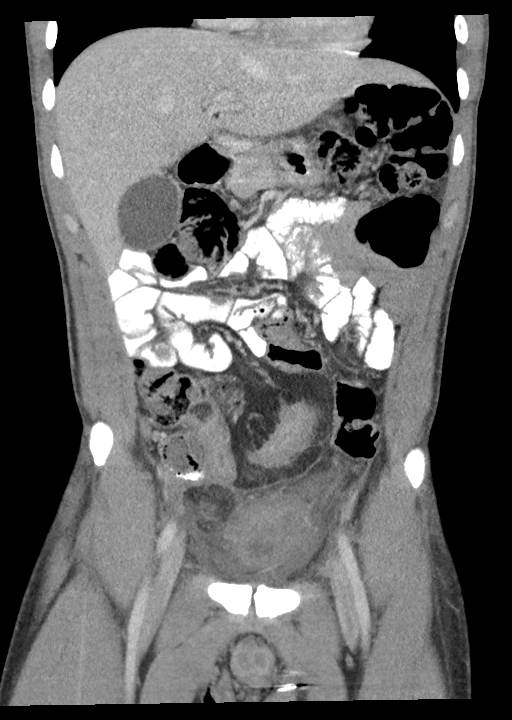
[im 38/85  soft-tissue]
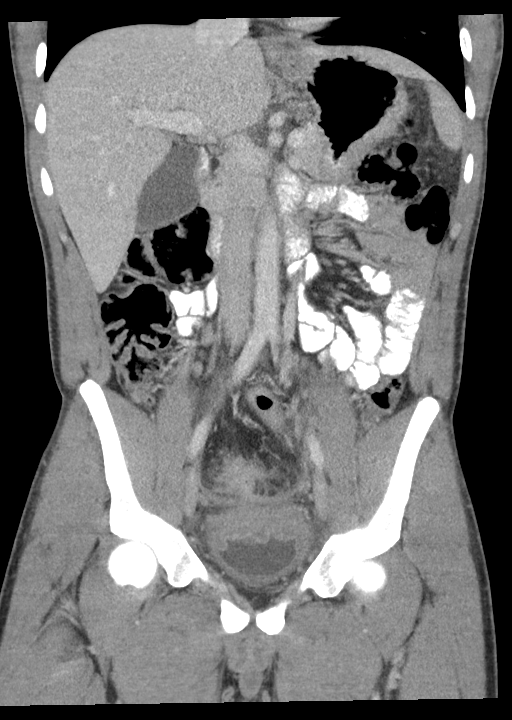
[im 47/85  soft-tissue]
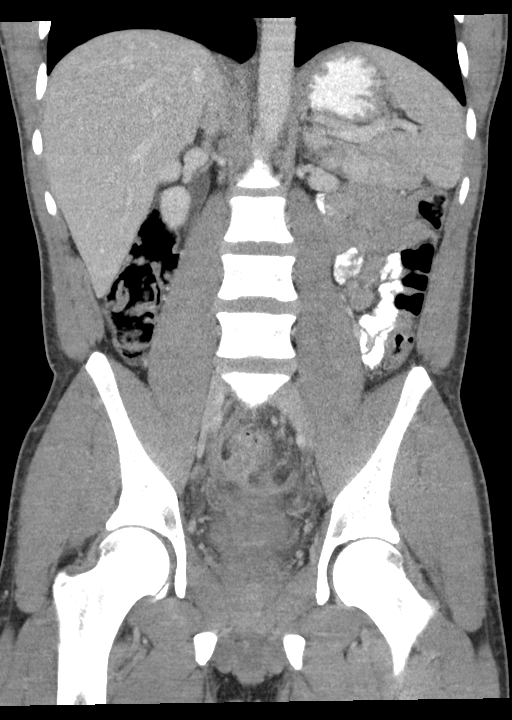

[15 of 46 positions shown; findings below may reference images not displayed]

FINDINGS: Lower chest: Lung bases are clear. No effusions. Heart is normal
size.

Hepatobiliary: No focal hepatic abnormality. Gallbladder
unremarkable.

Pancreas: No focal abnormality or ductal dilatation.

Spleen: No focal abnormality.  Normal size.

Adrenals/Urinary Tract: Marked bladder wall thickening along the
superior bladder wall this is likely secondarily inflamed from
inflamed adjacent bowel. Fistula described below courses near the
superior bladder wall. It is difficult to completely exclude this
extends and involves the bladder wall superiorly. No hydronephrosis.
Adrenal glands unremarkable.

Stomach/Bowel: Markedly inflamed and thickened terminal ileum. There
appears to be a fistulous tract extending from the terminal ileum
posteriorly to the sigmoid colon. This fistulous tract is also near
the superior wall of the bladder which is markedly thickened. No
evidence of bowel obstruction.

Vascular/Lymphatic: No evidence of aneurysm or adenopathy.

Reproductive: No visible focal abnormality.

Other: Small amount of free fluid in the pelvis.  No free air.

Musculoskeletal: No acute bony abnormality.
IMPRESSION: Markedly abnormal terminal ileum with wall thickening and
inflammation. There appears to be a fistulous tract which extends
posteriorly from the terminal ileum to the sigmoid colon. This
passes near the superior wall of the urinary bladder which is
markedly thickened secondarily. It is difficult to exclude
involvement of the superior bladder wall with this fistulous tract.
No visible intraluminal gas within the bladder. No evidence of bowel
obstruction.

Small amount of free fluid in the pelvis. No focal fluid
collection/abscess.

## 2020-06-17 ENCOUNTER — Encounter: Payer: Self-pay | Admitting: Internal Medicine

## 2020-06-17 ENCOUNTER — Other Ambulatory Visit: Payer: Self-pay

## 2020-06-17 ENCOUNTER — Telehealth (INDEPENDENT_AMBULATORY_CARE_PROVIDER_SITE_OTHER): Payer: BC Managed Care – PPO | Admitting: Internal Medicine

## 2020-06-17 VITALS — BP 128/83 | HR 69 | Temp 97.1°F | Ht 72.0 in | Wt 221.4 lb

## 2020-06-17 DIAGNOSIS — K50819 Crohn's disease of both small and large intestine with unspecified complications: Secondary | ICD-10-CM

## 2020-06-17 DIAGNOSIS — K50013 Crohn's disease of small intestine with fistula: Secondary | ICD-10-CM | POA: Diagnosis not present

## 2020-06-17 NOTE — Patient Instructions (Signed)
We will start the process give getting you back on Stelara but need to check some blood work today prior to doing so including viral hepatitis B and tuberculosis testing.  Once we have these results back, we will work on getting the Stelara to you.  Follow-up with me in 3 months or sooner if needed.  At [facility] we value your feedback. You may receive a survey about your visit today. Please share your experience as we strive to create trusting relationships with our patients to provide genuine, compassionate, quality care.  We appreciate your understanding and patience as we review any laboratory studies, imaging, and other diagnostic tests that are ordered as we care for you. Our office policy is 5 business days for review of these results, and any emergent or urgent results are addressed in a timely manner for your best interest. If you do not hear from our office in 1 week, please contact us.   We also encourage the use of MyChart, which contains your medical information for your review as well. If you are not enrolled in this feature, an access code is on this after visit summary for your convenience. Thank you for allowing [place] to be involved in your care.  It was great to see you today!  I hope you have a great rest of your winter!!    Elon Alas. Abbey Chatters, D.O. Gastroenterology and Hepatology [facility]

## 2020-06-18 NOTE — Progress Notes (Signed)
Referring Provider: No ref. provider found Primary Care Physician:  Patient, No Pcp Per Primary GI:  Dr. Abbey Chatters  Chief Complaint  Patient presents with  . Follow-up    Fistula removal x 2 (done at [facility]), wants to restart Stelara    HPI:   Gerald Valentine is a 35 y.o. male who presents to the clinic today for follow-up visit.  He has a history of Crohn's disease for nearly 20 years.  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  He subsequently underwent surgical resection with end ileostomy.  This was recently changed to a end to side ileocolostomy at [facility].  As far as Crohn's management, prior to surgery patient was on Stelara and wishes to restart this now.  Previously was on Humira in the past but states this stopped working for him.  Today, patient states he feels great.  He has added weight.  States the surgery was life-changing.  Has normal bowel movements, no diarrhea.  No mucus or hematochezia.  Past Medical History:  Diagnosis Date  . Crohn's disease of both small and large intestine [facility])     Past Surgical History:  Procedure Laterality Date  . BIOPSY  05/09/2017   Procedure: BIOPSY;  Surgeon: [name], MD;  [facility];  Service: Endoscopy;;  colon  . COLONOSCOPY    . COLONOSCOPY N/A 07/10/2015   Dr. Oneida Alar: stricture in distal ileum due to Crohn's. pseudopolyp in descending colon. rectum pathology with active colitis, left and ascending colon with quiescent colitis, rectum with active colitis. 6 month Surveillance  needed after starting Humira  (which was May 2017)   . COLONOSCOPY WITH PROPOFOL N/A 05/09/2017   stricture at IC valve, redundant colon, no colonic ulcerations visibly seen, multiple biopsies of colon with benign colonic mucosa  . None to date     As of 05/18/15    Current Outpatient Medications  Medication Sig Dispense Refill  . cholecalciferol (VITAMIN D3) 25 MCG (1000 UT)  tablet Take 2 tablets (2,000 Units total) by mouth daily. (Patient taking differently: Take 1,000 Units by mouth daily.) 30 tablet 11  . cyanocobalamin (,VITAMIN B-12,) 1000 MCG/ML injection 1068mg Oval once weekly X 4. Then 10068m [place] once per month FOREVER. 4 mL 1  . Multiple Vitamins-Minerals (MENS MULTIVITAMIN PO) Take by mouth daily.    . Delsa Grana0 MG/ML SOSY injection Inject into the skin. Takes every 12 weeks    . Adalimumab (HUMIRA) 40 MG/0.8ML PSKT Inject 0.8 mLs (40 mg total) into the skin every 7 (seven) days. (Patient not taking: No sig reported) 4 each 3  . budesonide (ENTOCORT EC) 3 MG 24 hr capsule Take 3 capsules (9 mg total) by mouth daily. (Patient not taking: No sig reported) 60 capsule 1  . calcium carbonate (TUMS) 500 MG chewable tablet Chew 1 tablet (200 mg of elemental calcium total) by mouth 3 (three) times daily. (Patient not taking: Reported on 06/17/2020) 90 tablet 11  . Vitamin D, Ergocalciferol, (DRISDOL) 1.25 MG (50000 UT) CAPS capsule Take 1 capsule (50,000 Units total) by mouth every 7 (seven) days. (Patient not taking: Reported on 06/17/2020) 8 capsule 0   No current facility-administered medications for this visit.    Allergies as of 06/17/2020 - Review Complete 06/17/2020  Allergen Reaction Noted  . Remicade [infliximab] Shortness Of Breath 05/11/2017    Family History  Problem Relation Age of Onset  . Hypertension Mother   .  Liver cancer Father        deceased age 29  . CVA Father   . Colon cancer Neg Hx     Social History   Socioeconomic History  . Marital status: Single    Spouse name: Not on file  . Number of children: Not on file  . Years of education: Not on file  . Highest education level: Not on file  Occupational History  . Not on file  Tobacco Use  . Smoking status: Never Smoker  . Smokeless tobacco: Never Used  Vaping Use  . Vaping Use: Never used  Substance and Sexual Activity  . Alcohol use: No    Alcohol/week: 0.0 standard  drinks  . Drug use: No  . Sexual activity: Yes    Birth control/protection: None  Other Topics Concern  . Not on file  Social History Narrative  . Not on file   Social Determinants of Health   Financial Resource Strain: Not on file  Food Insecurity: Not on file  Transportation Needs: Not on file  Physical Activity: Not on file  Stress: Not on file  Social Connections: Not on file    Subjective: Review of Systems  Constitutional: Negative for chills and fever.  HENT: Negative for congestion and hearing loss.   Eyes: Negative for blurred vision and double vision.  Respiratory: Negative for cough and shortness of breath.   Cardiovascular: Negative for chest pain and palpitations.  Gastrointestinal: Negative for abdominal pain, blood in stool, constipation, diarrhea, heartburn, melena and vomiting.  Genitourinary: Negative for dysuria and urgency.  Musculoskeletal: Negative for joint pain and myalgias.  Skin: Negative for itching and rash.  Neurological: Negative for dizziness and headaches.  Psychiatric/Behavioral: Negative for depression. The patient is not nervous/anxious.      Objective: BP 128/83   Pulse 69   Temp (!) 97.1 F (36.2 C) (Temporal)   Ht 6' (1.829 m)   Wt 221 lb 6.4 oz (100.4 kg)   BMI 30.03 kg/m  Physical Exam Constitutional:      Appearance: Normal appearance.  HENT:     Head: Normocephalic and atraumatic.  Eyes:     Extraocular Movements: Extraocular movements intact.     Conjunctiva/sclera: Conjunctivae normal.  Cardiovascular:     Rate and Rhythm: Normal rate and regular rhythm.  Pulmonary:     Effort: Pulmonary effort is normal.     Breath sounds: Normal breath sounds.  Abdominal:     General: Bowel sounds are normal.     Palpations: Abdomen is soft.  Musculoskeletal:        General: Normal range of motion.     Cervical back: Normal range of motion and neck supple.  Skin:    General: Skin is warm.  Neurological:     General: No  focal deficit present.     Mental Status: He is alert and oriented to person, place, and time.  Psychiatric:        Mood and Affect: Mood normal.        Behavior: Behavior normal.      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  We will start the process of getting him back on treatment for his Crohn's disease with Stelara.  Check tuberculosis testing as well as viral hepatitis B status  We will check baseline CBC, CMP, and ESR  We will then  start the process of getting patient restarted on Stelara.  He will need reinduction dosing with 520 mg IV x1 then 90 mg subcutaneous every 12 weeks.  Patient follow-up in 3 months or sooner if needed.  We will tentatively plan a repeat colonoscopy in 6 to 12 months.  06/18/2020 1:24 PM   Disclaimer: This note was dictated with voice recognition software. Similar sounding words can inadvertently be transcribed and may not be corrected upon review.

## 2020-06-19 LAB — COMPLETE METABOLIC PANEL WITH GFR
AG Ratio: 1.8 (calc) (ref 1.0–2.5)
ALT: 22 U/L (ref 9–46)
AST: 18 U/L (ref 10–40)
Albumin: 4.4 g/dL (ref 3.6–5.1)
Alkaline phosphatase (APISO): 61 U/L (ref 36–130)
BUN: 12 mg/dL (ref 7–25)
CO2: 28 mmol/L (ref 20–32)
Calcium: 9.7 mg/dL (ref 8.6–10.3)
Chloride: 105 mmol/L (ref 98–110)
Creat: 1.28 mg/dL (ref 0.60–1.35)
GFR, Est African American: 85 mL/min/{1.73_m2} (ref >=60)
GFR, Est Non African American: 74 mL/min/{1.73_m2} (ref >=60)
Globulin: 2.5 g/dL (calc) (ref 1.9–3.7)
Glucose, Bld: 87 mg/dL (ref 65–99)
Potassium: 4.5 mmol/L (ref 3.5–5.3)
Sodium: 141 mmol/L (ref 135–146)
Total Bilirubin: 0.7 mg/dL (ref 0.2–1.2)
Total Protein: 6.9 g/dL (ref 6.1–8.1)

## 2020-06-19 LAB — SEDIMENTATION RATE: Sed Rate: 2 mm/h (ref 0–15)

## 2020-06-19 LAB — HEPATITIS B SURFACE ANTIGEN: Hepatitis B Surface Ag: NONREACTIVE

## 2020-06-19 LAB — QUANTIFERON-TB GOLD PLUS
Mitogen-NIL: >10 IU/mL
NIL: 0.04 IU/mL
QuantiFERON-TB Gold Plus: NEGATIVE
TB1-NIL: 0 IU/mL
TB2-NIL: <0 IU/mL

## 2020-06-19 LAB — HEPATITIS B SURFACE ANTIBODY,QUALITATIVE: Hep B S Ab: REACTIVE — AB

## 2020-06-19 LAB — HEPATITIS B CORE ANTIBODY, TOTAL: Hep B Core Total Ab: NONREACTIVE

## 2020-07-01 ENCOUNTER — Telehealth: Payer: Self-pay

## 2020-07-01 NOTE — Telephone Encounter (Signed)
Phoned to [facility] and found out the problem with the pt's medication. One PA was for pre-filled and the other PA for the vials. Pt approved for the pre-filled 02/26/2020 through 02/25/2021. The PA for the vial was taken out of the system. Called and advised the pt to call and schedule with the courier and that he will have a co-pay to take care. The pt agreed.

## 2020-07-14 ENCOUNTER — Encounter: Payer: Self-pay | Admitting: Internal Medicine

## 2020-08-11 ENCOUNTER — Telehealth: Payer: Self-pay | Admitting: Internal Medicine

## 2020-08-11 NOTE — Telephone Encounter (Signed)
Pt called to say that he has been waiting 2 weeks for his medication and we were doing a PA on it. He was calling to follow up on that. Please call [phone]

## 2020-08-17 ENCOUNTER — Telehealth: Payer: Self-pay | Admitting: Internal Medicine

## 2020-08-17 NOTE — Telephone Encounter (Signed)
The pt returned call and I advised him that on March 9th I spoke with him and advised him then that medication was approved and I also contacted [facility] and advised them. I had given the pt instructions then to contact the courier and scheduled a time for his medication, he agreed to that but instead he called [facility]. I advised the pt being on this medication he should know who and when to call. He advised he got confused. So he is calling the courier today.

## 2020-08-17 NOTE — Telephone Encounter (Signed)
Returned pt's call, no answer LMOVM to return call

## 2020-08-17 NOTE — Telephone Encounter (Signed)
Please call patient regarding his medication. He said he has been waiting longer than 3 weeks. I told him multiple times that the nurse was aware and she was working on it and would call him with an update. He was getting hostile on the phone with me and I assured him again that the nurse was aware and would call him back. [phone]

## 2020-08-19 ENCOUNTER — Telehealth: Payer: Self-pay

## 2020-08-19 ENCOUNTER — Telehealth: Payer: Self-pay | Admitting: Internal Medicine

## 2020-08-19 NOTE — Telephone Encounter (Signed)
Faxed Rx to [facility] @ [phone] for the pts Stelara. Advised the pt to call them in a hour to make sure they have it.

## 2020-08-19 NOTE — Telephone Encounter (Signed)
Pt called asking to speak with nurse about his prescription. I told him the nurse was discharging a patient and would call him back. [phone]

## 2020-08-19 NOTE — Telephone Encounter (Signed)
Pt called stating that [facility] needed Rx faxed to them. I faxed the pt's office note with his Rx for Stelara on it to [phone]. Also advised the pt to call them in one hour to make sure they have it.

## 2020-08-19 NOTE — Telephone Encounter (Signed)
Returned the pt's call no answer, LMOVM to return call and that he may be transferred to my vm.

## 2020-08-24 ENCOUNTER — Telehealth: Payer: Self-pay | Admitting: Internal Medicine

## 2020-08-24 NOTE — Telephone Encounter (Signed)
Patient needs Stelara prescription faxed to [facility]    Patient phone is [phone]

## 2020-08-24 NOTE — Telephone Encounter (Signed)
Returned pt's call and advised I had faxed on the 28th of last month, so I faxed it again to [facility] again and advised the pt of this.

## 2020-09-16 ENCOUNTER — Telehealth: Payer: Self-pay

## 2020-09-16 NOTE — Telephone Encounter (Signed)
Returned the pt's call was advised by him that he got a letter from his insurance company stating he needed to switch pharmacies for his Rx because he is still with the same company but he switched jobs. I asked him did his insurance change and he said yes. So I advised the pt that he needs to get new insurance to [place] incase I have to do another PA for his Stelara . He stated he couldn't do it before Friday. I also asked him to go into his HR office and them to fax [place] a copy of it because it needs to be put in the file here. Waiting to get insurance information, but in the meantime will contact previous source who handles his Rx.Marland Kitchen spoke to julie regarding this matter and she agrees pt needs to bring insurance and letter. I called the pt back to make sure he brings the letter and insurance information. He agreed.

## 2020-10-09 ENCOUNTER — Telehealth: Payer: Self-pay

## 2020-10-09 NOTE — Telephone Encounter (Signed)
The pt brought by a letter from [facility] on yesterday regarding the pt getting his Sterlara 75m/ml. They are saying you can call [phone] they will help to get this pt's Rx transferred or I need [facility] does require your signature. (Cannot be stamped). So please advise what you would like to do.

## 2020-11-20 NOTE — Telephone Encounter (Signed)
Good Morning Dr. Abbey Chatters, This pt called back this morning regarding the note from June 16th. It was regarding his medication and he needed new Rx sent to [facility] or to call and give them a verbal @ [phone]. Pt states he has been without his medication.

## 2021-06-14 ENCOUNTER — Telehealth: Payer: Self-pay | Admitting: Internal Medicine

## 2021-06-14 NOTE — Telephone Encounter (Signed)
Good morning, Dr. Carlean Purl,  (DoD for 06/14/21, a.m.)  This patient called requesting a transfer of care after his primary doctor referred him to our practice. He has been followed by [facility], but his doctor there left and whoever took over his care has not helped him at all.  He has not even received any of his colitis medications since 2020.  He feels like our practice would provide him the type of care he needs.  His records are in [place].  Please let me know if you approve of this transfer.  Thank you.

## 2021-06-14 NOTE — Telephone Encounter (Signed)
Looks like there were a lot of prior auth issues with Stelara in [place]  I think we can accept this patient.  I am willing to see him but if one of the newer MD's has sooner availability (needs to be booked in anew patient slot) please schedule with them.  Thanks  CEG

## 2021-08-04 ENCOUNTER — Other Ambulatory Visit (INDEPENDENT_AMBULATORY_CARE_PROVIDER_SITE_OTHER): Payer: BC Managed Care – PPO

## 2021-08-04 ENCOUNTER — Ambulatory Visit (INDEPENDENT_AMBULATORY_CARE_PROVIDER_SITE_OTHER): Payer: BC Managed Care – PPO | Admitting: Internal Medicine

## 2021-08-04 ENCOUNTER — Encounter: Payer: Self-pay | Admitting: Internal Medicine

## 2021-08-04 ENCOUNTER — Other Ambulatory Visit: Payer: Self-pay

## 2021-08-04 VITALS — BP 126/72 | HR 78 | Ht 72.0 in | Wt 231.0 lb

## 2021-08-04 DIAGNOSIS — Z9049 Acquired absence of other specified parts of digestive tract: Secondary | ICD-10-CM

## 2021-08-04 DIAGNOSIS — Z800 Family history of malignant neoplasm of digestive organs: Secondary | ICD-10-CM | POA: Insufficient documentation

## 2021-08-04 DIAGNOSIS — K50818 Crohn's disease of both small and large intestine with other complication: Secondary | ICD-10-CM

## 2021-08-04 DIAGNOSIS — E559 Vitamin D deficiency, unspecified: Secondary | ICD-10-CM | POA: Diagnosis not present

## 2021-08-04 DIAGNOSIS — E611 Iron deficiency: Secondary | ICD-10-CM

## 2021-08-04 DIAGNOSIS — D509 Iron deficiency anemia, unspecified: Secondary | ICD-10-CM

## 2021-08-04 DIAGNOSIS — E538 Deficiency of other specified B group vitamins: Secondary | ICD-10-CM | POA: Insufficient documentation

## 2021-08-04 DIAGNOSIS — K5080 Crohn's disease of both small and large intestine without complications: Secondary | ICD-10-CM | POA: Insufficient documentation

## 2021-08-04 DIAGNOSIS — K50019 Crohn's disease of small intestine with unspecified complications: Secondary | ICD-10-CM | POA: Insufficient documentation

## 2021-08-04 HISTORY — DX: Iron deficiency: E61.1

## 2021-08-04 HISTORY — DX: Deficiency of other specified B group vitamins: E53.8

## 2021-08-04 LAB — CBC WITH DIFFERENTIAL/PLATELET
Basophils Absolute: 0 10*3/uL (ref 0.0–0.1)
Basophils Relative: 0.8 % (ref 0.0–3.0)
Eosinophils Absolute: 0.1 10*3/uL (ref 0.0–0.7)
Eosinophils Relative: 2 % (ref 0.0–5.0)
HCT: 43.4 % (ref 39.0–52.0)
Hemoglobin: 14.5 g/dL (ref 13.0–17.0)
Lymphocytes Relative: 38.7 % (ref 12.0–46.0)
Lymphs Abs: 1.7 10*3/uL (ref 0.7–4.0)
MCHC: 33.3 g/dL (ref 30.0–36.0)
MCV: 90.7 fl (ref 78.0–100.0)
Monocytes Absolute: 0.4 10*3/uL (ref 0.1–1.0)
Monocytes Relative: 9.2 % (ref 3.0–12.0)
Neutro Abs: 2.2 10*3/uL (ref 1.4–7.7)
Neutrophils Relative %: 49.3 % (ref 43.0–77.0)
Platelets: 226 10*3/uL (ref 150.0–400.0)
RBC: 4.78 Mil/uL (ref 4.22–5.81)
RDW: 13.7 % (ref 11.5–15.5)
WBC: 4.5 10*3/uL (ref 4.0–10.5)

## 2021-08-04 LAB — COMPREHENSIVE METABOLIC PANEL
ALT: 38 U/L (ref 0–53)
AST: 28 U/L (ref 0–37)
Albumin: 4.8 g/dL (ref 3.5–5.2)
Alkaline Phosphatase: 79 U/L (ref 39–117)
BUN: 10 mg/dL (ref 6–23)
CO2: 27 mEq/L (ref 19–32)
Calcium: 9.3 mg/dL (ref 8.4–10.5)
Chloride: 103 mEq/L (ref 96–112)
Creatinine, Ser: 1.4 mg/dL (ref 0.40–1.50)
GFR: 65.86 mL/min (ref >60.00)
Glucose, Bld: 98 mg/dL (ref 70–99)
Potassium: 3.5 mEq/L (ref 3.5–5.1)
Sodium: 139 mEq/L (ref 135–145)
Total Bilirubin: 0.6 mg/dL (ref 0.2–1.2)
Total Protein: 7.5 g/dL (ref 6.0–8.3)

## 2021-08-04 LAB — VITAMIN B12: Vitamin B-12: 113 pg/mL — ABNORMAL LOW (ref 211–911)

## 2021-08-04 LAB — VITAMIN D 25 HYDROXY (VIT D DEFICIENCY, FRACTURES): VITD: 24.79 ng/mL — ABNORMAL LOW (ref 30.00–100.00)

## 2021-08-04 LAB — FOLATE: Folate: 8.1 ng/mL (ref >5.9)

## 2021-08-04 LAB — SEDIMENTATION RATE: Sed Rate: 12 mm/hr (ref 0–15)

## 2021-08-04 LAB — FERRITIN: Ferritin: 10.9 ng/mL — ABNORMAL LOW (ref 22.0–322.0)

## 2021-08-04 LAB — C-REACTIVE PROTEIN: CRP: <1 mg/dL (ref 0.5–20.0)

## 2021-08-04 MED ORDER — VITAMIN D (ERGOCALCIFEROL) 1.25 MG (50000 UNIT) PO CAPS: 50000.0000 [IU] | ORAL_CAPSULE | ORAL | 0 refills | Status: DC

## 2021-08-04 MED ORDER — FERROUS SULFATE 325 (65 FE) MG PO TBEC: 325.0000 mg | DELAYED_RELEASE_TABLET | Freq: Every day | ORAL | 3 refills | Status: DC

## 2021-08-04 NOTE — Progress Notes (Signed)
? ?Gerald Valentine 36 y.o. 01-Sep-1985 [id number] ? ?Assessment & Plan:  ? ?Encounter Diagnoses  ?Name Primary?  ? Crohn's disease of both small and large intestine with other complication ([name])   ? S/P right colectomy Yes  ? Family history of colon cancer in father- dx age 3   ? Vitamin D deficiency   ? ?He seems to be doing fairly well at this time.  Reassess below with labs.  He may be a candidate for chronic therapy to prevent recurrence.  Given the disease he has had in the past biologic therapy makes the most sense.  I think it would be prudent to reassess with colonoscopy and the labs below.  May need some cross-sectional imaging also.  He did have active inflammation in the pathology at his ileostomy takedown so it does sound like continuing with therapy makes sense and Stelara may be very appropriate.  I do not think I would use anti-tumor necrosis factor medication again.  He progressed to a need for surgery after Humira.  He had an allergy to Remicade.  Further plans pending these results. ? ?Orders Placed This Encounter  ?Procedures  ? CALPROTECTIN  ? CBC w/Diff  ? Comp Met (CMET)  ? Ferritin  ? B12  ? Folate  ? Sed Rate (ESR)  ? C-reactive protein  ? QuantiFERON-TB Gold Plus  ? Vitamin D (25 hydroxy)  ? Ambulatory referral to Gastroenterology  ? ? ?Copy to Etter Sjogren, FNP ? ? ?Subjective:  ? ?Chief Complaint: Establish care for Crohn's disease ? 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 ?He has done well after the surgery.  Much better with occasional postprandial diarrhea hours later.  He has been able to gain weight.  He has been actively lifting weights.  In general he tries to avoid processed foods, and eat whole foods.  Lots of vegetables and fruit he does not eat pork but does eat beef and chicken.  He is interested in transfer of GI care.  He had previously followed with Gerald Valentine at [facility] but after she left and after his surgery there was some miscommunication or difficulty, he saw Gerald Valentine and there were plans to start Stelara since the patient had progressed on Humira in the past.  However there was some problems getting prior authorization I think the patient may have been lost to follow-up with this, he was frustrated and requested to transfer to our practice. ? ?He is not having other active health conditions.  He reports a history of a heart murmur diagnosed as a child.  He exercises regularly both cardio and weightlifting.  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  He is a Librarian, academic.  Starting [employer] program soon. ? ?I have reviewed previous GI records at [facility] and in the [facility] system.  He was seen for a wellness check in [place] in October 2022.  He does have a history of vitamin D deficiency.  He was on supplements and remains on them.  Labs at that time 02/19/2021 with a hemoglobin 14.2 MCV 94 platelets 252.  ALT was 58 AST 39 alk phos 99 bilirubin normal creatinine 1.3 glucose 94 vitamin D was 21.  Triglycerides 94 HDL 60.  Albumin 4.4. ?Allergies  ?Allergen Reactions  ? Remicade [Infliximab] Shortness Of Breath  ? ?Current Meds  ?Medication Sig  ? cholecalciferol (VITAMIN D3) 25 MCG (1000 UNIT) tablet Take 1,000 Units by mouth daily.  ? Multiple Vitamins-Minerals (MENS MULTIVITAMIN PO) Take by mouth daily.  ? ?Past Medical History:  ?Diagnosis Date  ? Crohn's  disease of both small and large intestine ([place])   ? Heart murmur   ? Vitamin D deficiency   ? ?Past Surgical History:  ?Procedure Laterality Date  ? BIOPSY  05/09/2017  ? Procedure: BIOPSY;  Surgeon: Gerald Binder, MD;  [facility];  Service: Endoscopy;;  colon  ? COLONOSCOPY N/A 07/10/2015  ? Gerald Valentine: stricture in distal ileum due to Crohn's. pseudopolyp in descending colon. rectum pathology with active colitis, left and ascending colon with quiescent colitis, rectum with active colitis. 6 month Surveillance  needed after starting Humira  (which was May 2017)   ? COLONOSCOPY WITH PROPOFOL N/A 05/09/2017  ? stricture at IC valve, redundant colon, no colonic ulcerations visibly seen, multiple biopsies of colon with benign colonic mucosa  ? ILEOSTOMY CLOSURE  11/2018  ? [facility]  ? RIGHT COLECTOMY  07/2018  ? [facility]  ? ?Social History  ? ?Social History Narrative  ? Patient is single he lives alone he has 5 sons and 2 daughters  ? He is a Librarian, academic with a BS in business and is going to pursue an MBA (2023)  ? Never smoker no drug use no alcohol no caffeine  ? He is a Arts administrator at [employer] in [place].  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  Previously worked at [employer].  ? ?family history includes CVA in his father; Colon cancer in his father; Hypertension in his father and mother; Liver cancer in his father. ? ? ?Review of Systems ?As per HPI all other review of systems negative ? ?Objective:  ? Physical Exam ?_0  126/72   Pulse 78   Ht 6' (1.829 m)   Wt 231 lb (104.8 kg)   BMI 31.33 kg/m? @ ? ?General:  Well-developed, well-nourished and in no acute distress ?Eyes:  anicteric. ?ENT:   Mouth and posterior pharynx free of lesions.  ?Neck:   supple w/o thyromegaly or mass.  ?Lungs: Clear to auscultation bilaterally. ?Heart:  S1S2, no rubs, murmurs, gallops. ?Abdomen:  soft, non-tender, no hepatosplenomegaly, hernia, or mass and BS+. RUQ and  small infraumbilical midline scar ?Rectal: inspection external ok ?Lymph:  no cervical or supraclavicular adenopathy. ?Extremities:   no edema, cyanosis or clubbing ?Skin   no rash. ?Neuro:  A&O x 3.  ?Psych:  appropriate mood and  Affect. ? ? ?Data Reviewed: ?See HPI ?I have reviewed surgery notes and records from [facility] from 2020, prior [facility] notes, colonoscopy reports in the computer pathology reports and imaging in the computer as well. ? ?

## 2021-08-04 NOTE — Patient Instructions (Addendum)
If you are age 34 or older, your body mass index should be between 23-30. Your Body mass index is 31.33 kg/m?Marland Kitchen If this is out of the aforementioned range listed, please consider follow up with your Primary Care Provider. ? ?If you are age 84 or younger, your body mass index should be between 19-25. Your Body mass index is 31.33 kg/m?Marland Kitchen If this is out of the aformentioned range listed, please consider follow up with your Primary Care Provider.  ? ?________________________________________________________ ? ?The [facility] providers would like to encourage you to use [facility] to communicate with providers for non-urgent requests or questions.  Due to long hold times on the telephone, sending your provider a message by [facility] may be a faster and more efficient way to get a response.  Please allow 48 business hours for a response.  Please remember that this is for non-urgent requests.  ?_______________________________________________________ ?Your provider has requested that you go to the basement level for lab work before leaving today. Press "B" on the elevator. The lab is located at the first door on the left as you exit the elevator. ? ?Due to recent changes in healthcare laws, you may see the results of your imaging and laboratory studies on MyChart before your provider has had a chance to review them.  We understand that in some cases there may be results that are confusing or concerning to you. Not all laboratory results come back in the same time frame and the provider may be waiting for multiple results in order to interpret others.  Please give [place] 48 hours in order for your provider to thoroughly review all the results before contacting the office for clarification of your results.  ? ?You have been scheduled for a colonoscopy. Please follow written instructions given to you at your visit today.  ?Please pick up your prep supplies at the pharmacy within the next 1-3 days. ?If you use inhalers (even only as  needed), please bring them with you on the day of your procedure. ? ? ?I appreciate the opportunity to care for you. ?Silvano Rusk, MD, [facility] ?

## 2021-08-05 ENCOUNTER — Other Ambulatory Visit: Payer: Self-pay

## 2021-08-05 DIAGNOSIS — E538 Deficiency of other specified B group vitamins: Secondary | ICD-10-CM

## 2021-08-05 DIAGNOSIS — K50818 Crohn's disease of both small and large intestine with other complication: Secondary | ICD-10-CM

## 2021-08-05 MED ORDER — CYANOCOBALAMIN 1000 MCG/ML IJ SOLN: 1000.0000 ug | INTRAMUSCULAR | 4 refills | Status: DC

## 2021-08-09 ENCOUNTER — Other Ambulatory Visit: Payer: Self-pay

## 2021-08-09 ENCOUNTER — Telehealth: Payer: Self-pay | Admitting: Internal Medicine

## 2021-08-09 DIAGNOSIS — K50818 Crohn's disease of both small and large intestine with other complication: Secondary | ICD-10-CM

## 2021-08-09 DIAGNOSIS — D509 Iron deficiency anemia, unspecified: Secondary | ICD-10-CM

## 2021-08-09 DIAGNOSIS — E538 Deficiency of other specified B group vitamins: Secondary | ICD-10-CM

## 2021-08-09 DIAGNOSIS — E559 Vitamin D deficiency, unspecified: Secondary | ICD-10-CM

## 2021-08-09 MED ORDER — VITAMIN D (ERGOCALCIFEROL) 1.25 MG (50000 UNIT) PO CAPS: 50000.0000 [IU] | ORAL_CAPSULE | ORAL | 0 refills | Status: DC

## 2021-08-09 MED ORDER — FERROUS SULFATE 325 (65 FE) MG PO TBEC: 325.0000 mg | DELAYED_RELEASE_TABLET | Freq: Every day | ORAL | 3 refills | Status: AC

## 2021-08-09 MED ORDER — CYANOCOBALAMIN 1000 MCG/ML IJ SOLN: 1000.0000 ug | INTRAMUSCULAR | 4 refills | Status: DC

## 2021-08-09 NOTE — Telephone Encounter (Signed)
Inbound call from patient stating that his B12, Ferrous Sulfate and Drisol were sent to the wrong pharmacy. [facility] [place] [address] ? ?[phone] ?

## 2021-08-09 NOTE — Telephone Encounter (Signed)
Prescriptions sent to pt pharmacy that pt  requested: Pt made aware: ?Pt verbalized understanding with all questions answered.  ? ?

## 2021-08-10 LAB — QUANTIFERON-TB GOLD PLUS
Mitogen-NIL: >10 IU/mL
NIL: 0.05 IU/mL
QuantiFERON-TB Gold Plus: NEGATIVE
TB1-NIL: 0.01 IU/mL
TB2-NIL: 0 IU/mL

## 2021-08-10 LAB — CALPROTECTIN

## 2021-08-11 ENCOUNTER — Other Ambulatory Visit: Payer: BC Managed Care – PPO

## 2021-08-11 ENCOUNTER — Ambulatory Visit (INDEPENDENT_AMBULATORY_CARE_PROVIDER_SITE_OTHER): Payer: BC Managed Care – PPO | Admitting: Internal Medicine

## 2021-08-11 DIAGNOSIS — E538 Deficiency of other specified B group vitamins: Secondary | ICD-10-CM | POA: Diagnosis not present

## 2021-08-11 DIAGNOSIS — Z800 Family history of malignant neoplasm of digestive organs: Secondary | ICD-10-CM

## 2021-08-11 DIAGNOSIS — K50818 Crohn's disease of both small and large intestine with other complication: Secondary | ICD-10-CM

## 2021-08-11 DIAGNOSIS — Z9049 Acquired absence of other specified parts of digestive tract: Secondary | ICD-10-CM

## 2021-08-11 NOTE — Progress Notes (Signed)
Pt arrived today for teaching of the B-12 Injection: ?Medication brought with Visit: ?Pt stated that he has given himself injections  in the past such as Humira: ?Pt was demonstrated of how the draw up the medication up  through the syring: Clean injection sight: Give injection IM in thigh:  ? ?Pt verbalized understanding with all questions answered.  ?  ?

## 2021-08-12 ENCOUNTER — Encounter: Payer: Self-pay | Admitting: Internal Medicine

## 2021-08-16 LAB — CALPROTECTIN, FECAL: Calprotectin, Fecal: 53 ug/g (ref 0–120)

## 2021-08-26 ENCOUNTER — Encounter: Payer: Self-pay | Admitting: Internal Medicine

## 2021-09-02 ENCOUNTER — Encounter: Payer: BC Managed Care – PPO | Admitting: Internal Medicine

## 2021-09-23 ENCOUNTER — Encounter: Payer: Self-pay | Admitting: Internal Medicine

## 2021-09-23 NOTE — Telephone Encounter (Signed)
Pt stated that he took two Ducolox suppositories about 30 minutes ago. Pt stated that he has already called poison control and they informed him to just carry on with a normal diet and continue with Bowel Prep as instructed. Pt is scheduled for a procedure tomorrow and wanted Dr Carlean Purl to know. Please advise

## 2021-09-24 ENCOUNTER — Encounter: Payer: Self-pay | Admitting: Internal Medicine

## 2021-09-24 ENCOUNTER — Ambulatory Visit (AMBULATORY_SURGERY_CENTER): Payer: BC Managed Care – PPO | Admitting: Internal Medicine

## 2021-09-24 VITALS — BP 142/94 | HR 86 | Temp 98.0°F | Resp 15 | Ht 72.0 in | Wt 231.0 lb

## 2021-09-24 DIAGNOSIS — K50818 Crohn's disease of both small and large intestine with other complication: Secondary | ICD-10-CM | POA: Diagnosis not present

## 2021-09-24 DIAGNOSIS — K5000 Crohn's disease of small intestine without complications: Secondary | ICD-10-CM | POA: Diagnosis not present

## 2021-09-24 DIAGNOSIS — Z9049 Acquired absence of other specified parts of digestive tract: Secondary | ICD-10-CM

## 2021-09-24 MED ORDER — SODIUM CHLORIDE 0.9 % IV SOLN: 500.0000 mL | Freq: Once | INTRAVENOUS | Status: DC

## 2021-09-24 NOTE — Progress Notes (Signed)
[facility], transported to PACU °

## 2021-09-24 NOTE — Op Note (Signed)
[place] Patient Name: Gerald Valentine Procedure Date: 09/24/2021 4:22 PM MRN: [id number] Endoscopist: Gatha Mayer , MD Age: 36 Referring MD:  Date of Birth: 1985/09/04 Gender: Male Account #: [id number] Procedure:                Colonoscopy Indications:              Crohn's disease of the small bowel and colon,                            Disease activity assessment of Crohn's disease of                            the small bowel and colon Medicines:                Monitored Anesthesia Care Procedure:                Pre-Anesthesia Assessment:                           - Prior to the procedure, a History and Physical                            was performed, and patient medications and                            allergies were reviewed. The patient's tolerance of                            previous anesthesia was also reviewed. The risks                            and benefits of the procedure and the sedation                            options and risks were discussed with the patient.                            All questions were answered, and informed consent                            was obtained. Prior Anticoagulants: The patient has                            taken no previous anticoagulant or antiplatelet                            agents. ASA Grade Assessment: II - A patient with                            mild systemic disease. After reviewing the risks                            and benefits, the patient was deemed in  satisfactory condition to undergo the procedure.                           After obtaining informed consent, the colonoscope                            was passed under direct vision. Throughout the                            procedure, the patient's blood pressure, pulse, and                            oxygen saturations were monitored continuously. The                            Olympus CF-HQ190L (Serial# [id number])  Colonoscope was                            introduced through the anus and advanced to the 15                            cm into the ileum. The colonoscopy was performed                            without difficulty. The patient tolerated the                            procedure well. The quality of the bowel                            preparation was good. The terminal ileum, the                            rectum and Ileocolonic anastomsis areas were                            photographed. The bowel preparation used was                            Miralax via split dose instruction. Scope In: 4:32:38 PM Scope Out: 4:47:33 PM Scope Withdrawal Time: 0 hours 7 minutes 53 seconds  Total Procedure Duration: 0 hours 14 minutes 55 seconds  Findings:                 The perianal and digital rectal examinations were                            normal. Pertinent negatives include normal prostate                            (size, shape, and consistency).                           Patchy inflammation, mild in severity and  characterized by shallow ulcerations was found in                            the neo-terminal Ileum. Biopsies were taken with a                            cold forceps for histology. Verification of patient                            identification for the specimen was done. Estimated                            blood loss was minimal.                           There was evidence of a prior end-to-end                            ileo-colonic anastomosis in the transverse colon.                            This was patent and was characterized by                            inflammation. The anastomosis was traversed.                           The exam was otherwise without abnormality on                            direct and retroflexion views.                           Biopsies for histology were taken with a cold                            forceps from the  entire colon for evaluation of                            microscopic colitis. Estimated blood loss was                            minimal. Complications:            No immediate complications. Estimated Blood Loss:     Estimated blood loss was minimal. Impression:               - Crohn's disease with ileitis. Biopsied.                           - Patent end-to-end ileo-colonic anastomosis,                            characterized by inflammation.                           -  The examination was otherwise normal on direct                            and retroflexion views.                           - Biopsies were taken with a cold forceps from the                            entire colon for evaluation of microscopic colitis. Recommendation:           - Patient has a contact number available for                            emergencies. The signs and symptoms of potential                            delayed complications were discussed with the                            patient. Return to normal activities tomorrow.                            Written discharge instructions were provided to the                            patient.                           - Resume previous diet.                           - Continue present medications.                           - Await pathology results.                           - Repeat colonoscopy is recommended. The                            colonoscopy date will be determined after pathology                            results from today's exam become available for                            review.                           - W ewill arrange office follow-up to review                            pathology and decide on treatment vs observation  and any other evaluation (? CT/MR-E) Gatha Mayer, MD 09/24/2021 5:00:41 PM This report has been signed electronically.

## 2021-09-24 NOTE — Patient Instructions (Addendum)
There is some active inflammation in the small intestine. I took biopsies of that and the normal colon.  We will arrange follow-up and discuss next steps - continued observation vs starting something like Stelara  I appreciate the opportunity to care for you. Gatha Mayer, MD, [facility]  Discharge instructions given. Biopsies taken. Resume previous medications. YOU HAD AN ENDOSCOPIC PROCEDURE TODAY AT [facility]:   Refer to the procedure report that was given to you for any specific questions about what was found during the examination.  If the procedure report does not answer your questions, please call your gastroenterologist to clarify.  If you requested that your care partner not be given the details of your procedure findings, then the procedure report has been included in a sealed envelope for you to review at your convenience later.  YOU SHOULD EXPECT: Some feelings of bloating in the abdomen. Passage of more gas than usual.  Walking can help get rid of the air that was put into your GI tract during the procedure and reduce the bloating. If you had a lower endoscopy (such as a colonoscopy or flexible sigmoidoscopy) you may notice spotting of blood in your stool or on the toilet paper. If you underwent a bowel prep for your procedure, you may not have a normal bowel movement for a few days.  Please Note:  You might notice some irritation and congestion in your nose or some drainage.  This is from the oxygen used during your procedure.  There is no need for concern and it should clear up in a day or so.  SYMPTOMS TO REPORT IMMEDIATELY:  Following lower endoscopy (colonoscopy or flexible sigmoidoscopy):  Excessive amounts of blood in the stool  Significant tenderness or worsening of abdominal pains  Swelling of the abdomen that is new, acute  Fever of 100F or higher   For urgent or emergent issues, a gastroenterologist can be reached at any hour by calling [phone]  [phone]. Do not use MyChart messaging for urgent concerns.    DIET:  We do recommend a small meal at first, but then you may proceed to your regular diet.  Drink plenty of fluids but you should avoid alcoholic beverages for 24 hours.  ACTIVITY:  You should plan to take it easy for the rest of today and you should NOT DRIVE or use heavy machinery until tomorrow (because of the sedation medicines used during the test).    FOLLOW UP: Our staff will call the number listed on your records 48-72 hours following your procedure to check on you and address any questions or concerns that you may have regarding the information given to you following your procedure. If we do not reach you, we will leave a message.  We will attempt to reach you two times.  During this call, we will ask if you have developed any symptoms of COVID 19. If you develop any symptoms (ie: fever, flu-like symptoms, shortness of breath, cough etc.) before then, please call [phone].  If you test positive for Covid 19 in the 2 weeks post procedure, please call and report this information to [place].    If any biopsies were taken you will be contacted by phone or by letter within the next 1-3 weeks.  Please call us at [phone] if you have not heard about the biopsies in 3 weeks.    SIGNATURES/CONFIDENTIALITY: You and/or your care partner have signed paperwork which will be entered into your electronic medical record.  These signatures attest to the fact that that the information above on your After Visit Summary has been reviewed and is understood.  Full responsibility of the confidentiality of this discharge information lies with you and/or your care-partner.

## 2021-09-24 NOTE — Progress Notes (Signed)
[facility] History and Physical   Primary Care Physician:  Patient, No Pcp Per (Inactive)   Reason for Procedure:   Crohn's evaluation  Plan:    colonoscopy     HPI: Gerald Valentine is a 36 y.o. male here for assessment of disease actrivity - Crohn's large and small intestine s/p right hemicolectomy He also has a FHx CRCA in father at age 59   Past Medical History:  Diagnosis Date   Allergy    B12 deficiency 08/04/2021   Crohn's disease of both small and large intestine ([place])    Heart murmur    Iron deficiency 08/04/2021   Vitamin D deficiency     Past Surgical History:  Procedure Laterality Date   BIOPSY  05/09/2017   Procedure: BIOPSY;  Surgeon: [name], MD;  [facility];  Service: Endoscopy;;  colon   COLONOSCOPY N/A 07/10/2015   Dr. Oneida Alar: stricture in distal ileum due to Crohn's. pseudopolyp in descending colon. rectum pathology with active colitis, left and ascending colon with quiescent colitis, rectum with active colitis. 6 month Surveillance  needed after starting Humira  (which was May 2017)    COLONOSCOPY WITH PROPOFOL N/A 05/09/2017   stricture at IC valve, redundant colon, no colonic ulcerations visibly seen, multiple biopsies of colon with benign colonic mucosa   ILEOSTOMY CLOSURE  11/2018   [facility]   RIGHT COLECTOMY  07/2018   [facility]    Prior to Admission medications   Medication Sig Start Date End Date Taking? Authorizing Provider  cholecalciferol (VITAMIN D3) 25 MCG (1000 UNIT) tablet Take 1,000 Units by mouth daily.    [provider]  cyanocobalamin (,VITAMIN B-12,) 1000 MCG/ML injection Inject 1 mL (1,000 mcg total) into the muscle every 30 (thirty) days. 08/09/21   Gatha Mayer, MD  ferrous sulfate 325 (65 FE) MG EC tablet Take 1 tablet (325 mg total) by mouth daily. Patient not taking: Reported on 09/24/2021 08/09/21   Gatha Mayer, MD  Multiple Vitamins-Minerals (MENS MULTIVITAMIN PO) Take  by mouth daily.    [provider]  Vitamin D, Ergocalciferol, (DRISDOL) 1.25 MG (50000 UNIT) CAPS capsule Take 1 capsule (50,000 Units total) by mouth every 7 (seven) days. 08/09/21   Gatha Mayer, MD    Current Outpatient Medications  Medication Sig Dispense Refill   cholecalciferol (VITAMIN D3) 25 MCG (1000 UNIT) tablet Take 1,000 Units by mouth daily.     cyanocobalamin (,VITAMIN B-12,) 1000 MCG/ML injection Inject 1 mL (1,000 mcg total) into the muscle every 30 (thirty) days. 3 mL 4   ferrous sulfate 325 (65 FE) MG EC tablet Take 1 tablet (325 mg total) by mouth daily. (Patient not taking: Reported on 09/24/2021) 90 tablet 3   Multiple Vitamins-Minerals (MENS MULTIVITAMIN PO) Take by mouth daily.     Vitamin D, Ergocalciferol, (DRISDOL) 1.25 MG (50000 UNIT) CAPS capsule Take 1 capsule (50,000 Units total) by mouth every 7 (seven) days. 8 capsule 0   Current Facility-Administered Medications  Medication Dose Route Frequency Provider Last Rate Last Admin   0.9 %  sodium chloride infusion  500 mL Intravenous Once Gatha Mayer, MD        Allergies as of 09/24/2021 - Review Complete 09/24/2021  Allergen Reaction Noted   Remicade [infliximab] Shortness Of Breath 05/11/2017    Family History  Problem Relation Age of Onset   Hypertension Mother    Liver cancer Father        deceased  age 56   CVA Father    Colon cancer Father    Hypertension Father     Social History   Socioeconomic History   Marital status: Single    Spouse name: Not on file   Number of children: Not on file   Years of education: Not on file   Highest education level: Not on file  Occupational History   Not on file  Tobacco Use   Smoking status: Never   Smokeless tobacco: Never  Vaping Use   Vaping Use: Never used  Substance and Sexual Activity   Alcohol use: Yes    Comment: occ.   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  Previously worked at [employer].   Social Determinants of Health   Financial Resource Strain: Not on file  Food Insecurity: Not on file  Transportation Needs: Not on file  Physical Activity: Not on file  Stress: Not on file  Social Connections: Not on file  Intimate Partner Violence: Not on file    Review of Systems:  All other review of systems negative except as mentioned in the HPI.  Physical Exam: Vital signs BP 128/72   Pulse 80   Temp 98 F (36.7 C) (Skin)   Ht 6' (1.829 m)   Wt 231 lb (104.8 kg)   SpO2 100%   BMI 31.33 kg/m   General:   Alert,  Well-developed, well-nourished, pleasant and cooperative in NAD Lungs:  Clear throughout to auscultation.   Heart:  Regular rate and rhythm; no murmurs, clicks, rubs,  or gallops. Abdomen:  Soft, nontender and nondistended. Normal bowel sounds.   Neuro/Psych:  Alert and cooperative. Normal mood and affect. A and O x 3   @Ardon Franklin  Simonne Maffucci, MD, [facility] [phone] (pager) 09/24/2021 4:24 PM@

## 2021-09-24 NOTE — Progress Notes (Signed)
Called to room to assist during endoscopic procedure.  Patient ID and intended procedure confirmed with present staff. Received instructions for my participation in the procedure from the performing physician.  

## 2021-09-24 NOTE — Progress Notes (Signed)
Pt's states no medical or surgical changes since previsit or office visit. VS assessed by D.T 

## 2021-09-27 ENCOUNTER — Telehealth: Payer: Self-pay

## 2021-09-27 NOTE — Telephone Encounter (Signed)
  Follow up Call-     09/24/2021    3:12 PM  Call back number  Post procedure Call Back phone  # [phone]  Permission to leave phone message Yes     Patient questions:  Do you have a fever, pain , or abdominal swelling? No. Pain Score  0 *  Have you tolerated food without any problems? Yes.    Have you been able to return to your normal activities? Yes.    Do you have any questions about your discharge instructions: Diet   No. Medications  No. Follow up visit  No.  Do you have questions or concerns about your Care? No.  Actions: * If pain score is 4 or above: No action needed, pain <4.

## 2021-10-05 ENCOUNTER — Other Ambulatory Visit: Payer: Self-pay

## 2021-10-05 DIAGNOSIS — K50019 Crohn's disease of small intestine with unspecified complications: Secondary | ICD-10-CM

## 2021-10-18 ENCOUNTER — Ambulatory Visit (HOSPITAL_COMMUNITY)
Admission: RE | Admit: 2021-10-18 | Discharge: 2021-10-18 | Disposition: A | Discharge status: Home / Self Care | Payer: BC Managed Care – PPO | Source: Ambulatory Visit | Attending: Internal Medicine | Admitting: Internal Medicine

## 2021-10-18 DIAGNOSIS — K50019 Crohn's disease of small intestine with unspecified complications: Secondary | ICD-10-CM | POA: Insufficient documentation

## 2021-10-18 MED ORDER — IOHEXOL 300 MG/ML  SOLN
100.0000 mL | Freq: Once | INTRAMUSCULAR | Status: AC | PRN
  Administered 2021-10-18: 100 mL via INTRAVENOUS

## 2021-10-18 MED ORDER — SODIUM CHLORIDE (PF) 0.9 % IJ SOLN
INTRAMUSCULAR | Status: AC
  Filled 2021-10-18: qty 50

## 2021-12-02 ENCOUNTER — Ambulatory Visit: Payer: BC Managed Care – PPO | Admitting: Internal Medicine

## 2021-12-02 ENCOUNTER — Encounter: Payer: Self-pay | Admitting: Internal Medicine

## 2021-12-02 ENCOUNTER — Other Ambulatory Visit: Payer: Self-pay | Admitting: Internal Medicine

## 2021-12-02 ENCOUNTER — Other Ambulatory Visit (INDEPENDENT_AMBULATORY_CARE_PROVIDER_SITE_OTHER): Payer: BC Managed Care – PPO

## 2021-12-02 DIAGNOSIS — K50019 Crohn's disease of small intestine with unspecified complications: Secondary | ICD-10-CM | POA: Diagnosis not present

## 2021-12-02 DIAGNOSIS — E538 Deficiency of other specified B group vitamins: Secondary | ICD-10-CM

## 2021-12-02 DIAGNOSIS — Z9049 Acquired absence of other specified parts of digestive tract: Secondary | ICD-10-CM | POA: Diagnosis not present

## 2021-12-02 DIAGNOSIS — E559 Vitamin D deficiency, unspecified: Secondary | ICD-10-CM

## 2021-12-02 LAB — VITAMIN D 25 HYDROXY (VIT D DEFICIENCY, FRACTURES): VITD: 16.36 ng/mL — ABNORMAL LOW (ref 30.00–100.00)

## 2021-12-02 MED ORDER — VITAMIN D (ERGOCALCIFEROL) 1.25 MG (50000 UNIT) PO CAPS: 50000.0000 [IU] | ORAL_CAPSULE | ORAL | 0 refills | Status: AC

## 2021-12-02 MED ORDER — STELARA 90 MG/ML ~~LOC~~ SOSY: 90.0000 mg | PREFILLED_SYRINGE | SUBCUTANEOUS | 5 refills | Status: DC

## 2021-12-02 NOTE — Progress Notes (Signed)
Gerald Valentine 36 y.o. Mar 11, 1986 [id number]  Assessment & Plan:   Encounter Diagnoses  Name Primary?   Crohn's disease of small intestine with complication ([place])    I14 deficiency    S/P right colectomy    Vitamin D deficiency    The patient is without significant symptoms with the disease but he does have disease activity as evidenced by colonoscopy/ileoscopy and CT enterography.  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  Plans:  Start Stelara for Crohn's disease.  Reviewed risks benefits and indications.  Start oral and injectable B12.  We will give him an example of the syringe he needs to use.  Start iron supplementation  Recheck vitamin D  Determine follow-up pending the above, likely in about 4 months.  Remind patient to establish with a PCP.  Subjective:   Chief Complaint: Follow-up of Crohn's disease  HPI 36 year old African-American man with Crohn's disease here for follow-up after establishing care with me this spring.  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 Drue Flirt Path normal colon but chronic active ileitis on small bowel August 2020 closure of ileostomy and end-to-side ileocolostomy, ileal biopsies with chronic active inflammation transmural  Fecal calprotectin 08/11/2021 53 Lab Results  Component Value Date   FERRITIN 10.9 (L) 08/04/2021   Lab Results  Component Value Date   VITAMINB12 113 (L) 08/04/2021   Lab Results  Component Value Date   WBC 4.5 08/04/2021   HGB 14.5 08/04/2021   HCT 43.4 08/04/2021   MCV 90.7 08/04/2021   PLT 226.0 08/04/2021   Last vitamin D Lab Results  Component Value Date   VD25OH 24.79 (L) 08/04/2021   QuantiFERON was  negative and he is immune to hepatitis B   Colonoscopy September 24, 2021 - Crohn's disease with ileitis. Biopsied. - Patent end-to-end ileo-colonic anastomosis, characterized by inflammation. - The examination was otherwise normal on direct and retroflexion views. - Biopsies were taken with a cold forceps from the entire colon for evaluation of microscopic colitis.  Pathology results 1. Surgical [P], small bowel, terminal ileum PATCHY ACTIVE ILEITIS WITH BLUNTING OF VILLI CONSISTENT WITH HISTORY OF CROHN'S DISEASE. NO DYSPLASIA OR MALIGNANCY IS SEEN. 2. Surgical [P], colon nos, random sites FRAGMENTS OF LARGE BOWEL MUCOSA WITH A RARE LYMPHOID AGGREGATE BUT OTHERWISE NO ACTIVE INFLAMMATION, CHRONIC CRYPT ARCHITECTURAL CHANGES, DYSPLASIA OR MALIGNANCY IS SEEN IN THE CURRENT SPECIMEN.   CT enterography October 18, 2021 IMPRESSION: 1. Low-level inflammatory findings in the distal 4 cm of small bowel extending to the ileocolic anastomosis. The appearance favors low-grade Crohn's disease. 2. 2 mm nonobstructive right kidney upper pole calculus. 3. Trace bilateral pleural effusions, cause uncertain.     Current history/symptoms  2-3 stools a day loose to formed.  No significant pain.  Feels well otherwise.  He did not get the syringes for his B12, he was instructed on how to inject but he forgot what type of syringe and did not call back to clarify.  He was supposed to start oral iron and has not done so, he did take his vitamin D supplements.   Allergies  Allergen Reactions   Remicade [Infliximab] Shortness Of Breath   Current Meds  Medication Sig   cholecalciferol (VITAMIN D3) 25 MCG (1000 UNIT) tablet Take 1,000 Units by mouth  daily.   cyanocobalamin (,VITAMIN B-12,) 1000 MCG/ML injection Inject 1 mL (1,000 mcg total) into the muscle every 30 (thirty) days.   Multiple Vitamins-Minerals (MENS MULTIVITAMIN PO) Take by mouth daily.   Past Medical History:  Diagnosis Date   Allergy     B12 deficiency 08/04/2021   Crohn's disease of both small and large intestine ([place])    Heart murmur    Iron deficiency 08/04/2021   Vitamin D deficiency    Past Surgical History:  Procedure Laterality Date   BIOPSY  05/09/2017   Procedure: BIOPSY;  Surgeon: [name], MD;  [facility];  Service: Endoscopy;;  colon   COLONOSCOPY N/A 07/10/2015   Dr. Oneida Alar: stricture in distal ileum due to Crohn's. pseudopolyp in descending colon. rectum pathology with active colitis, left and ascending colon with quiescent colitis, rectum with active colitis. 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  Previously worked at [employer].   family history includes CVA in his father; Colon cancer in his father; Hypertension in his father and mother; Liver cancer in his father.   Review of Systems As above  Objective:   Physical Exam BP (!) 130/94 (BP Location: Left Arm, Patient Position: Sitting, Cuff Size: Large)   Pulse 76   Ht 6' (1.829 m) Comment: height measured without shoes  Wt 229 lb 4 oz (104 kg)   BMI 31.09 kg/m  Well-developed well-nourished young black man in no acute distress  Abdomen has evidence of prior right upper quadrant ileostomy and low midline scars.  No organomegaly mass or tenderness.   Data reviewed see  HPI.

## 2021-12-02 NOTE — Patient Instructions (Addendum)
If you are age 34 or younger, your body mass index should be between 19-25. Your Body mass index is 31.09 kg/m. If this is out of the aformentioned range listed, please consider follow up with your Primary Care Provider.  ________________________________________________________  The [facility] providers would like to encourage you to use [facility] to communicate with providers for non-urgent requests or questions.  Due to long hold times on the telephone, sending your provider a message by [facility] may be a faster and more efficient way to get a response.  Please allow 48 business hours for a response.  Please remember that this is for non-urgent requests.  _______________________________________________________  Your provider has requested that you go to the basement level for lab work before leaving today. Press "B" on the elevator. The lab is located at the first door on the left as you exit the elevator.  Due to recent changes in healthcare laws, you may see the results of your imaging and laboratory studies on MyChart before your provider has had a chance to review them.  We understand that in some cases there may be results that are confusing or concerning to you. Not all laboratory results come back in the same time frame and the provider may be waiting for multiple results in order to interpret others.  Please give [place] 48 hours in order for your provider to thoroughly review all the results before contacting the office for clarification of your results.   Someone will be in contact with you about Stelara infusions.  Please purchase the following medications over the counter and take as directed: START: Vitamin B12 1067mg one tablet daily. START: Iron (ferrous sulfate) 322mdaily.  Thank you for entrusting me with your care and choosing [facility] Dr CaSilvano RuskMD, [facility]

## 2021-12-03 ENCOUNTER — Telehealth: Payer: Self-pay

## 2021-12-03 ENCOUNTER — Telehealth: Payer: Self-pay | Admitting: Pharmacy Technician

## 2021-12-03 NOTE — Telephone Encounter (Signed)
-----   Message from Gatha Mayer, MD sent at 12/02/2021  5:25 PM EDT ----- Regarding: RE: Starting Stelara Thanks for the heads up  Remo Lipps - we will need to see where he can go - would try [facility] - he lives in [place] - not sure if he will need to go in [place]??  Stelara 520 mg IV x 1  CEG ----- Message ----- From: Wynetta Fines, CPhT Sent: 12/02/2021   4:21 PM EDT To: Gatha Mayer, MD Subject: RE: Starting Stelara                           Dr. Carlean Purl, We are out of network with his insurance plan.  Please refer patient to another site to prevent delay in treatment. Kim ----- Message ----- From: Gatha Mayer, MD Sent: 12/02/2021  11:50 AM EDT To: Wynetta Fines, CPhT; Gillermina Hu, RN Subject: Starting Jeanmarie Hubert Stelara infusion and sent Rx for SQ maintenance to specialty pharmacy listed in his chart  FYI to you both  Thanks  CEG

## 2021-12-03 NOTE — Telephone Encounter (Addendum)
Auth Submission: Pending - 8/21, submitted most recent clinical documentation to [id number], need 3-5 business days to review Payer: [facility] Medication & CPT/J Code(s) submitted: Stelara Infusion (Ustekinumab) [phone] Route of submission (phone, fax, portal): Cover my meds Auth type: Buy/Bill Units/visits requested: 520 mg Reference number: [id number] Approval from:  to  at [place] as Buy/Bill  Auth Submission: Approved Payer: [employer] [employer] Medication & CPT/J Code(s) submitted: Stelara injection (Ustekinumab) J3357 Route of submission (phone, fax, portal): Cover my meds Auth type: Pharmacy Units/visits requested: 90 mg Reference number: [id number] Approval from: 12/03/21 to 12/04/22 at [place] as Pharmacy   Will update once we receive a response.

## 2021-12-03 NOTE — Telephone Encounter (Signed)
Treatment plan form faxed to [facility] [phone] along with pt records: Pt made aware Pt was notified that there office should contact him within a week. Pt was notified that if he has not heard anything back from there office within a week to please call our office back so that we could check on the status of it:  Pt verbalized understanding with all questions answered.

## 2021-12-06 ENCOUNTER — Telehealth: Payer: Self-pay | Admitting: Pharmacy Technician

## 2021-12-06 ENCOUNTER — Other Ambulatory Visit (HOSPITAL_COMMUNITY): Payer: Self-pay

## 2021-12-06 ENCOUNTER — Encounter: Payer: Self-pay | Admitting: Internal Medicine

## 2021-12-06 NOTE — Telephone Encounter (Signed)
Patient Advocate Encounter  Received notification from [facility]. that prior authorization for [facility] 90MG is required.   PA submitted on 8.14.23 Key [id number] StatUs is pending    Luciano Cutter, CPhT Patient Advocate Phone: [phone]

## 2021-12-07 NOTE — Telephone Encounter (Signed)
error 

## 2021-12-09 NOTE — Telephone Encounter (Signed)
Reached out and spoke with pt regarding PAP paperwork, explained that I would send it to them via mail. Confirmed mailing address.  Pt expressed understanding. Will f/u once paperwork has been signed and returned.

## 2021-12-10 NOTE — Telephone Encounter (Signed)
Patient Advocate Encounter  Prior Authorization for Delsa Grana has been approved.    PA# --- Effective dates: 8.11.23 through 8.11.24  Linah Klapper B. CPhT P: [phone] F: [phone]

## 2021-12-13 ENCOUNTER — Telehealth: Payer: Self-pay | Admitting: Internal Medicine

## 2021-12-13 NOTE — Telephone Encounter (Signed)
Inbound call from specialty pharmacy stating [facility] . Also states they are transferring prescription and have further advise patient as well. States the phone number for the specialty pharmacy is [phone] and the fax number is [phone]. States specialty pharmacy will reach out if further needing to advise.  Thank you

## 2021-12-21 ENCOUNTER — Telehealth: Payer: Self-pay | Admitting: Pharmacy Technician

## 2021-12-21 NOTE — Telephone Encounter (Signed)
Stelara co-pay card: approved Id: [id number] Gr: [id number] Bin: [place] Pcn: PDMI EXP: 09/23/22 Amount: $9100/yr Phone: [phone]

## 2021-12-22 ENCOUNTER — Ambulatory Visit: Payer: BC Managed Care – PPO

## 2021-12-23 ENCOUNTER — Ambulatory Visit (INDEPENDENT_AMBULATORY_CARE_PROVIDER_SITE_OTHER): Payer: BC Managed Care – PPO | Admitting: *Deleted

## 2021-12-23 VITALS — BP 130/81 | HR 69 | Temp 97.9°F | Resp 18 | Ht 72.0 in | Wt 230.0 lb

## 2021-12-23 DIAGNOSIS — K50019 Crohn's disease of small intestine with unspecified complications: Secondary | ICD-10-CM

## 2021-12-23 MED ORDER — USTEKINUMAB 130 MG/26ML IV SOLN
520.0000 mg | Freq: Once | INTRAVENOUS | Status: AC
  Administered 2021-12-23: 520 mg via INTRAVENOUS
  Filled 2021-12-23: qty 104

## 2021-12-23 NOTE — Progress Notes (Signed)
Diagnosis: Crohn's Disease  Provider:  Marshell Garfinkel MD  Procedure: Infusion  IV Type: Peripheral, IV Location: R Hand  Stelera (Ustekinumab), Dose: 542m  Infusion Start Time: 19828pm  Infusion Stop Time: 1620 pm  Post Infusion IV Care: Observation period completed and Peripheral IV Discontinued  Discharge: Condition: Good, Destination: Home . AVS provided to patient.   Performed by:  WOren Beckmann RN

## 2021-12-24 ENCOUNTER — Encounter: Payer: Self-pay | Admitting: Internal Medicine

## 2021-12-24 ENCOUNTER — Telehealth: Payer: Self-pay | Admitting: Pharmacy Technician

## 2021-12-24 NOTE — Telephone Encounter (Signed)
Auth Submission: APPROVED Payer: [employer] [place] Medication & CPT/J Code(s) submitted: Stelara Infusion (Ustekinumab) J3358 Route of submission (phone, fax, portal): PORTAL Phone # Fax # Auth type: Buy/Bill Units/visits requested: X1 DOSE Reference number: ID: [id number] Approval from: 12/06/21 to 06/08/22   @[email or web address]  patient has received his induction dose.  Please submit PA for pharmacy benefit for maintenance dosing.

## 2021-12-29 ENCOUNTER — Telehealth: Payer: Self-pay

## 2021-12-29 NOTE — Telephone Encounter (Signed)
Received a faxed document from [place] stating ( Prescription Clarification Request): [facility] and spoke with [place] Getting one of the pharmacist: Verbal order for the Stelara was given with read back:  Sonia Baller verbalized understanding with all questions answered.

## 2022-01-03 NOTE — Telephone Encounter (Signed)
PA has been approved

## 2022-01-12 ENCOUNTER — Telehealth: Payer: Self-pay

## 2022-01-12 NOTE — Telephone Encounter (Signed)
Received prescription Clarification Request from [facility]:  Document completed and signed by Dr. Carlean Purl: [facility]ed to [facility]: [phone]

## 2022-12-28 ENCOUNTER — Other Ambulatory Visit: Payer: Self-pay | Admitting: Internal Medicine

## 2022-12-29 NOTE — Telephone Encounter (Signed)
He needs:  1) CBC, CMET, quantiferon  2) schedule a f/u appt w/ me  Once he has done # 1 and scheduled appt we can refill Stelara to get him to appiointment

## 2022-12-29 NOTE — Telephone Encounter (Signed)
Patient last seen for OV 12/02/21. Please advise on refills & if you would like patient to come in for a follow up. Thanks!

## 2022-12-30 ENCOUNTER — Other Ambulatory Visit: Payer: Self-pay

## 2022-12-30 DIAGNOSIS — K50818 Crohn's disease of both small and large intestine with other complication: Secondary | ICD-10-CM

## 2022-12-30 DIAGNOSIS — K50019 Crohn's disease of small intestine with unspecified complications: Secondary | ICD-10-CM

## 2022-12-30 NOTE — Telephone Encounter (Signed)
Left message for patient to call back.  Labs ordered. 

## 2023-01-04 ENCOUNTER — Telehealth: Payer: Self-pay | Admitting: Internal Medicine

## 2023-01-04 NOTE — Telephone Encounter (Signed)
Inbound call from patient stating he had a missed call. Please advise.

## 2023-01-05 NOTE — Telephone Encounter (Signed)
Spoke with patient & scheduled OV for 04/11/23 at 10:50 am. Advised him to come in for labs at his earliest convenience & where to go for labs. He's aware that we will send refills in until he can be seen for OV once he has completed labs. Pt verbalized all understanding.   Recommendations from Dr. Leone Payor on 12/29/22: He needs:   1) CBC, CMET, quantiferon   2) schedule a f/u appt w/ me   Once he has done # 1 and scheduled appt we can refill Stelara to get him to appiointment

## 2023-01-05 NOTE — Telephone Encounter (Signed)
Spoke with patient & scheduled OV for 04/11/23 at 10:50 am. Advised him to come in for labs at his earliest convenience & where to go for labs. He's aware that we will send refills in until he can be seen for OV once he has completed labs. Pt verbalized all understanding.

## 2023-02-01 ENCOUNTER — Other Ambulatory Visit (INDEPENDENT_AMBULATORY_CARE_PROVIDER_SITE_OTHER): Payer: BC Managed Care – PPO

## 2023-02-01 DIAGNOSIS — K50818 Crohn's disease of both small and large intestine with other complication: Secondary | ICD-10-CM | POA: Diagnosis not present

## 2023-02-01 LAB — CBC
HCT: 44.8 % (ref 39.0–52.0)
Hemoglobin: 14.5 g/dL (ref 13.0–17.0)
MCHC: 32.4 g/dL (ref 30.0–36.0)
MCV: 92 fL (ref 78.0–100.0)
Platelets: 264 10*3/uL (ref 150.0–400.0)
RBC: 4.87 Mil/uL (ref 4.22–5.81)
RDW: 13.1 % (ref 11.5–15.5)
WBC: 5 10*3/uL (ref 4.0–10.5)

## 2023-02-01 LAB — COMPREHENSIVE METABOLIC PANEL
ALT: 42 U/L (ref 0–53)
AST: 29 U/L (ref 0–37)
Albumin: 4.3 g/dL (ref 3.5–5.2)
Alkaline Phosphatase: 66 U/L (ref 39–117)
BUN: 9 mg/dL (ref 6–23)
CO2: 28 meq/L (ref 19–32)
Calcium: 9.6 mg/dL (ref 8.4–10.5)
Chloride: 103 meq/L (ref 96–112)
Creatinine, Ser: 1.32 mg/dL (ref 0.40–1.50)
GFR: 69.95 mL/min (ref >60.00)
Glucose, Bld: 84 mg/dL (ref 70–99)
Potassium: 3.7 meq/L (ref 3.5–5.1)
Sodium: 139 meq/L (ref 135–145)
Total Bilirubin: 0.7 mg/dL (ref 0.2–1.2)
Total Protein: 6.8 g/dL (ref 6.0–8.3)

## 2023-02-04 LAB — QUANTIFERON-TB GOLD PLUS
Mitogen-NIL: 9.71 [IU]/mL
NIL: 0.05 [IU]/mL
QuantiFERON-TB Gold Plus: NEGATIVE
TB1-NIL: 0.01 [IU]/mL
TB2-NIL: 0 [IU]/mL

## 2023-02-05 ENCOUNTER — Encounter: Payer: Self-pay | Admitting: Internal Medicine

## 2023-02-05 DIAGNOSIS — Z7960 Long term (current) use of unspecified immunomodulators and immunosuppressants: Secondary | ICD-10-CM | POA: Insufficient documentation

## 2023-03-15 ENCOUNTER — Telehealth: Payer: Self-pay | Admitting: Internal Medicine

## 2023-03-15 NOTE — Telephone Encounter (Signed)
Patient called and stated his insurance is requesting prior authorization so they can continuing paying for Stelara medication. Patient is requesting a call back. Please advise.

## 2023-03-15 NOTE — Telephone Encounter (Signed)
Prior Auth team- Could you please assist with Stelara prior auth?

## 2023-03-29 LAB — T4, FREE: Free T4: 1.1 ng/dL

## 2023-03-29 LAB — TSH: TSH: 1.26 (ref 0.41–5.90)

## 2023-03-29 LAB — CMP 10231: EGFR: 69

## 2023-03-30 ENCOUNTER — Other Ambulatory Visit: Payer: Self-pay

## 2023-03-30 ENCOUNTER — Other Ambulatory Visit (HOSPITAL_COMMUNITY): Payer: Self-pay

## 2023-03-30 ENCOUNTER — Telehealth: Payer: Self-pay

## 2023-03-30 MED ORDER — STELARA 90 MG/ML ~~LOC~~ SOSY: 90.0000 mg | PREFILLED_SYRINGE | SUBCUTANEOUS | 0 refills | Status: DC

## 2023-03-30 NOTE — Telephone Encounter (Signed)
Pharmacy Patient Advocate Encounter   Received notification from CoverMyMeds that prior authorization for Stelara 90 mg/1 ml Syringes is required/requested.   Insurance verification completed.   The patient is insured through [facility] .   Per test claim: PA required; PA submitted to above mentioned insurance via CoverMyMeds Key/confirmation #/EOC [id number] Status is pending

## 2023-03-30 NOTE — Telephone Encounter (Signed)
PA request has been Submitted. New Encounter created for follow up. For additional info see Pharmacy Prior Auth telephone encounter from 03/30/2023.

## 2023-03-30 NOTE — Telephone Encounter (Signed)
Pharmacy Patient Advocate Encounter  Received notification from [facility] that Prior Authorization for Stelara 90 mg/1 ml has been APPROVED from 03/29/2023 to 03/28/2024   PA #/Case ID/Reference #: [id number]

## 2023-04-05 ENCOUNTER — Other Ambulatory Visit (HOSPITAL_COMMUNITY): Payer: Self-pay

## 2023-04-11 ENCOUNTER — Encounter: Payer: Self-pay | Admitting: Internal Medicine

## 2023-04-11 ENCOUNTER — Ambulatory Visit: Payer: BC Managed Care – PPO | Admitting: Internal Medicine

## 2023-04-11 VITALS — BP 110/80 | HR 80 | Ht 72.0 in | Wt 246.1 lb

## 2023-04-11 DIAGNOSIS — Z7960 Long term (current) use of unspecified immunomodulators and immunosuppressants: Secondary | ICD-10-CM | POA: Diagnosis not present

## 2023-04-11 DIAGNOSIS — Z9049 Acquired absence of other specified parts of digestive tract: Secondary | ICD-10-CM

## 2023-04-11 DIAGNOSIS — E611 Iron deficiency: Secondary | ICD-10-CM

## 2023-04-11 DIAGNOSIS — E559 Vitamin D deficiency, unspecified: Secondary | ICD-10-CM

## 2023-04-11 DIAGNOSIS — Z800 Family history of malignant neoplasm of digestive organs: Secondary | ICD-10-CM | POA: Diagnosis not present

## 2023-04-11 DIAGNOSIS — E538 Deficiency of other specified B group vitamins: Secondary | ICD-10-CM

## 2023-04-11 DIAGNOSIS — K50818 Crohn's disease of both small and large intestine with other complication: Secondary | ICD-10-CM

## 2023-04-11 MED ORDER — STELARA 90 MG/ML ~~LOC~~ SOSY: 90.0000 mg | PREFILLED_SYRINGE | SUBCUTANEOUS | 5 refills | Status: DC

## 2023-04-11 NOTE — Progress Notes (Addendum)
Gerald Valentine 37 y.o. 08/06/1985 [id number]  Assessment & Plan:   Encounter Diagnoses  Name Primary?   Crohn's disease of both small and large intestine with other complication (HCC) Yes   Long-term use of immunosuppressant medication - Stelara    Family history of colon cancer in father- dx age 22    S/P right colectomy    Iron deficiency    B12 deficiency    Vitamin D deficiency       Crohn's Disease Stable without diarrhea or abdominal pain. Missed 1-2 doses of Stelara due to prior authorization issues. Last colonoscopy in June 2023 showed inflammation. -Ensure Stelara is resumed and taken regularly every 8 weeks. -Check on status of Stelara order placed on March 30, 2023.  We will work on this but I have advised the patient to call his pharmacy also. -Plan for repeat colonoscopy in April 2025 after a couple of doses of Stelara.  Recall placed -Reorder Stelara for a year.     The patient reports he had iron levels vitamin D levels and B12 checked at a new PCP recently.  We will request those records.  He does report that he was given a prescription for vitamin D but he needs to pick it up.  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 Drue Dun Path normal colon but chronic active ileitis on small bowel August 2020 closure of ileostomy and end-to-side ileocolostomy, ileal biopsies with chronic active inflammation transmural  Fecal calprotectin 08/11/2021 53  Colonoscopy September 24, 2021 - Crohn's disease with ileitis. Biopsied. - Patent end-to-end ileo-colonic anastomosis, characterized by inflammation. - The examination was otherwise normal on direct  and retroflexion views. - Biopsies were taken with a cold forceps from the entire colon for evaluation of microscopic colitis.   Pathology results 1. Surgical [P], small bowel, terminal ileum PATCHY ACTIVE ILEITIS WITH BLUNTING OF VILLI CONSISTENT WITH HISTORY OF CROHN'S DISEASE. NO DYSPLASIA OR MALIGNANCY IS SEEN. 2. Surgical [P], colon nos, random sites FRAGMENTS OF LARGE BOWEL MUCOSA WITH A RARE LYMPHOID AGGREGATE BUT OTHERWISE NO ACTIVE INFLAMMATION, CHRONIC CRYPT ARCHITECTURAL CHANGES, DYSPLASIA OR MALIGNANCY IS SEEN IN THE CURRENT SPECIMEN.   CT enterography October 18, 2021 IMPRESSION: 1. Low-level inflammatory findings in the distal 4 cm of small bowel extending to the ileocolic anastomosis. The appearance favors low-grade Crohn's disease. 2. 2 mm nonobstructive right kidney upper pole calculus. 3. Trace bilateral pleural effusions, cause uncertain.  August 2023-Stelara started for Crohn's disease  B12 deficiency noted oral treatment started also oral iron for iron deficiency and vitamin D deficiency was supplemented  --------------------------------------------------------------------------------------- Chief Complaint: Follow-up of Crohn's disease  HPI Discussed the use of AI scribe software for clinical note transcription with the patient, who gave verbal consent to proceed.  The patient, with a known history of Crohn's disease as outlined above, was last seen in August 2023 and was started on Stelara, administered every eight weeks. However, due to prior authorization issues, the patienthas  missed one or two doses of Stelara. The patient has not yet received the reordered dose of Stelara, which was requested on December 5th. Despite the missed doses, the patient denies experiencing any diarrhea or abdominal pain.      Allergies  Allergen Reactions   Remicade [Infliximab] Shortness Of Breath   Current Meds  Medication Sig   cholecalciferol (VITAMIN D3) 25 MCG (1000  UNIT) tablet Take 1,000 Units by mouth daily.   Cyanocobalamin (VITAMIN B-12 PO) Take 1 capsule by mouth daily.   ferrous sulfate 325 (65 FE) MG EC tablet Take 1 tablet (325 mg total) by mouth daily.   Multiple Vitamins-Minerals (MENS MULTIVITAMIN PO) Take by mouth daily.   ustekinumab (STELARA) 90 MG/ML SOSY injection Inject 1 mL (90 mg total) into the skin every 8 (eight) weeks.   Past Medical History:  Diagnosis Date   Allergy    B12 deficiency 08/04/2021   Crohn's disease of both small and large intestine (HCC)    Heart murmur    Iron deficiency 08/04/2021   Vitamin D deficiency    Past Surgical History:  Procedure Laterality Date   BIOPSY  05/09/2017   Procedure: BIOPSY;  Surgeon: West Bali, MD;  Location: [facility];  Service: Endoscopy;;  colon   COLONOSCOPY N/A 07/10/2015   Dr. Darrick Penna: stricture in distal ileum due to Crohn's. pseudopolyp in descending colon. rectum pathology with active colitis, left and ascending colon with quiescent colitis, rectum with active colitis. 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  Previously worked at [employer].   family history includes CVA in his father; Colon cancer in his father; Hypertension in his father and mother; Liver cancer in his father.   Review of Systems As per  HPI  Objective:   Physical Exam BP 110/80 (BP Location: Left Arm, Patient Position: Sitting, Cuff Size: Large)   Pulse 80   Ht 6' (1.829 m)   Wt 246 lb 2 oz (111.6 kg)   BMI 33.38 kg/m  Physical Exam   CHEST: Lungs clear to auscultation. CARDIOVASCULAR: Heart sounds normal with regular rate and rhythm, no murmurs, rubs, or gallops. ABDOMEN: Soft, nontender, no hepatosplenomegaly, no masses. Right upper quadrant and low midline scars, umbilical scar present. Bowel sounds present. No hernias.

## 2023-04-11 NOTE — Patient Instructions (Addendum)
We are going to request your lab work from 2024 done by your PCP.  We will place a colonoscopy recall for 07/2023.  [facility] at [phone] about your Stelara.   I appreciate the opportunity to care for you. Stan Head, MD, [facility]

## 2023-04-12 ENCOUNTER — Telehealth: Payer: Self-pay

## 2023-04-12 NOTE — Telephone Encounter (Signed)
[facility] that [facility] have [facility] the prescription and only waiting  for the pt to call them back for the delivery Routed as [facility]

## 2023-04-12 NOTE — Telephone Encounter (Signed)
-----   Message from Stan Head sent at 04/11/2023 12:45 PM EST ----- Regarding: Prior authorization? Saw this patient today  He had not yet received his Stelara refill even though we had prior authorization and prescription was sent on December 5  I reordered it for about a year but can we check with the pharmacy on the status of this and let me know?  Thanks

## 2023-04-12 NOTE — Telephone Encounter (Signed)
PJ - please relay this info to the patient - he needs to call

## 2023-04-12 NOTE — Telephone Encounter (Signed)
I left Jamille a detailed message to call [facility] at [phone] please.

## 2024-03-04 ENCOUNTER — Other Ambulatory Visit (HOSPITAL_COMMUNITY): Payer: Self-pay

## 2024-03-04 ENCOUNTER — Telehealth: Payer: Self-pay

## 2024-03-04 NOTE — Telephone Encounter (Signed)
 Pharmacy Patient Advocate Encounter   Received notification from Patient Insurance that prior authorization for Stelara  90MG /ML syringes is required/requested.   Insurance verification completed.   The patient is insured through [facility].   Per test claim: PA required; PA submitted to above mentioned insurance via Latent Key/confirmation #/EOC [id number] Status is pending

## 2024-03-05 ENCOUNTER — Other Ambulatory Visit (HOSPITAL_COMMUNITY): Payer: Self-pay

## 2024-03-05 NOTE — Telephone Encounter (Signed)
 Pt notified of approval via my chart.

## 2024-03-05 NOTE — Telephone Encounter (Signed)
 Pharmacy Patient Advocate Encounter  Received notification from [facility] that Prior Authorization for Stelara  90MG /ML syringes has been APPROVED from 03-05-2024 to 03-05-2025  Must fill at outside specialty pharmacy. Insurance will not pay at [facility].  PA #/Case ID/Reference #: [id number]

## 2024-03-27 ENCOUNTER — Other Ambulatory Visit (HOSPITAL_COMMUNITY): Payer: Self-pay

## 2024-03-27 ENCOUNTER — Other Ambulatory Visit: Payer: Self-pay | Admitting: Internal Medicine
